# Patient Record
Sex: Female | Born: 1980 | Race: White | Hispanic: No | Marital: Married | Smoking: Former smoker
Health system: Southern US, Community
[De-identification: ages and names within clinical notes are randomized; demographics above are authoritative.]

## PROBLEM LIST (undated history)

## (undated) DIAGNOSIS — F419 Anxiety disorder, unspecified: Secondary | ICD-10-CM

## (undated) DIAGNOSIS — F32A Depression, unspecified: Secondary | ICD-10-CM

## (undated) DIAGNOSIS — M549 Dorsalgia, unspecified: Secondary | ICD-10-CM

## (undated) DIAGNOSIS — E611 Iron deficiency: Secondary | ICD-10-CM

## (undated) DIAGNOSIS — E785 Hyperlipidemia, unspecified: Secondary | ICD-10-CM

## (undated) DIAGNOSIS — L719 Rosacea, unspecified: Secondary | ICD-10-CM

## (undated) DIAGNOSIS — M797 Fibromyalgia: Secondary | ICD-10-CM

## (undated) DIAGNOSIS — H00019 Hordeolum externum unspecified eye, unspecified eyelid: Secondary | ICD-10-CM

## (undated) HISTORY — DX: Hyperlipidemia, unspecified: E78.5

## (undated) HISTORY — DX: Rosacea, unspecified: L71.9

## (undated) HISTORY — DX: Fibromyalgia: M79.7

## (undated) HISTORY — DX: Dorsalgia, unspecified: M54.9

## (undated) HISTORY — DX: Depression, unspecified: F32.A

## (undated) HISTORY — DX: Iron deficiency: E61.1

## (undated) HISTORY — PX: WISDOM TOOTH EXTRACTION: SHX21

## (undated) HISTORY — DX: Hordeolum externum unspecified eye, unspecified eyelid: H00.019

## (undated) HISTORY — DX: Anxiety disorder, unspecified: F41.9

---
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
## 2002-04-22 ENCOUNTER — Inpatient Hospital Stay (HOSPITAL_COMMUNITY): Admission: AD | Admit: 2002-04-22 | Discharge: 2002-04-24 | Payer: Self-pay | Admitting: *Deleted

## 2004-11-20 ENCOUNTER — Emergency Department (HOSPITAL_COMMUNITY): Admission: EM | Admit: 2004-11-20 | Discharge: 2004-11-20 | Payer: Self-pay | Admitting: Emergency Medicine

## 2005-02-18 ENCOUNTER — Emergency Department (HOSPITAL_COMMUNITY): Admission: EM | Admit: 2005-02-18 | Discharge: 2005-02-18 | Payer: Self-pay | Admitting: Emergency Medicine

## 2005-04-12 ENCOUNTER — Inpatient Hospital Stay (HOSPITAL_COMMUNITY): Admission: AD | Admit: 2005-04-12 | Discharge: 2005-04-12 | Payer: Self-pay | Admitting: Obstetrics and Gynecology

## 2006-06-25 ENCOUNTER — Encounter: Admission: RE | Admit: 2006-06-25 | Discharge: 2006-06-25 | Payer: Self-pay | Admitting: Orthopaedic Surgery

## 2006-12-16 ENCOUNTER — Emergency Department (HOSPITAL_COMMUNITY): Admission: EM | Admit: 2006-12-16 | Discharge: 2006-12-17 | Payer: Self-pay | Admitting: Emergency Medicine

## 2007-04-22 ENCOUNTER — Encounter: Admission: RE | Admit: 2007-04-22 | Discharge: 2007-04-22 | Payer: Self-pay | Admitting: Family Medicine

## 2007-04-25 ENCOUNTER — Encounter: Admission: RE | Admit: 2007-04-25 | Discharge: 2007-04-25 | Payer: Self-pay | Admitting: Family Medicine

## 2007-05-02 ENCOUNTER — Encounter: Admission: RE | Admit: 2007-05-02 | Discharge: 2007-05-02 | Payer: Self-pay | Admitting: Family Medicine

## 2007-12-13 ENCOUNTER — Emergency Department (HOSPITAL_BASED_OUTPATIENT_CLINIC_OR_DEPARTMENT_OTHER): Admission: EM | Admit: 2007-12-13 | Discharge: 2007-12-13 | Payer: Self-pay | Admitting: Emergency Medicine

## 2008-01-01 ENCOUNTER — Emergency Department (HOSPITAL_BASED_OUTPATIENT_CLINIC_OR_DEPARTMENT_OTHER): Admission: EM | Admit: 2008-01-01 | Discharge: 2008-01-01 | Payer: Self-pay | Admitting: Emergency Medicine

## 2008-07-30 ENCOUNTER — Encounter: Admission: RE | Admit: 2008-07-30 | Discharge: 2008-07-30 | Payer: Self-pay | Admitting: Orthopaedic Surgery

## 2008-09-28 ENCOUNTER — Emergency Department (HOSPITAL_BASED_OUTPATIENT_CLINIC_OR_DEPARTMENT_OTHER): Admission: EM | Admit: 2008-09-28 | Discharge: 2008-09-28 | Payer: Self-pay | Admitting: Emergency Medicine

## 2009-01-12 ENCOUNTER — Emergency Department (HOSPITAL_BASED_OUTPATIENT_CLINIC_OR_DEPARTMENT_OTHER): Admission: EM | Admit: 2009-01-12 | Discharge: 2009-01-12 | Payer: Self-pay | Admitting: Emergency Medicine

## 2010-04-06 ENCOUNTER — Emergency Department (HOSPITAL_BASED_OUTPATIENT_CLINIC_OR_DEPARTMENT_OTHER): Admission: EM | Admit: 2010-04-06 | Discharge: 2010-04-06 | Payer: Self-pay | Admitting: Emergency Medicine

## 2010-04-06 ENCOUNTER — Ambulatory Visit: Payer: Self-pay | Admitting: Diagnostic Radiology

## 2010-06-08 ENCOUNTER — Emergency Department (HOSPITAL_BASED_OUTPATIENT_CLINIC_OR_DEPARTMENT_OTHER)
Admission: EM | Admit: 2010-06-08 | Discharge: 2010-06-08 | Payer: Self-pay | Source: Home / Self Care | Admitting: Emergency Medicine

## 2010-07-31 ENCOUNTER — Encounter: Payer: Self-pay | Admitting: Family Medicine

## 2010-07-31 ENCOUNTER — Encounter: Payer: Self-pay | Admitting: Orthopaedic Surgery

## 2010-09-20 LAB — HEMOCCULT GUIAC POC 1CARD (OFFICE): Fecal Occult Bld: POSITIVE

## 2010-09-22 LAB — URINALYSIS, ROUTINE W REFLEX MICROSCOPIC
Glucose, UA: NEGATIVE mg/dL
Ketones, ur: 15 mg/dL — AB
Leukocytes, UA: NEGATIVE
Nitrite: NEGATIVE
Protein, ur: NEGATIVE mg/dL
Specific Gravity, Urine: 1.031 — ABNORMAL HIGH (ref 1.005–1.030)
Urobilinogen, UA: 0.2 mg/dL (ref 0.0–1.0)
pH: 5.5 (ref 5.0–8.0)

## 2010-09-22 LAB — URINE MICROSCOPIC-ADD ON

## 2010-09-22 LAB — PREGNANCY, URINE: Preg Test, Ur: NEGATIVE

## 2010-10-16 LAB — URINE MICROSCOPIC-ADD ON

## 2010-10-16 LAB — URINALYSIS, ROUTINE W REFLEX MICROSCOPIC
Bilirubin Urine: NEGATIVE
Glucose, UA: NEGATIVE mg/dL
Protein, ur: NEGATIVE mg/dL
Urobilinogen, UA: 1 mg/dL (ref 0.0–1.0)

## 2011-04-27 LAB — DIFFERENTIAL
Eosinophils Absolute: 0.4
Eosinophils Relative: 5
Lymphs Abs: 3.3
Monocytes Absolute: 0.7
Monocytes Relative: 9

## 2011-04-27 LAB — CBC
MCHC: 34
RBC: 4.02
RDW: 11.7

## 2011-04-27 LAB — WET PREP, GENITAL
Clue Cells Wet Prep HPF POC: NONE SEEN
Trich, Wet Prep: NONE SEEN
Yeast Wet Prep HPF POC: NONE SEEN

## 2011-04-27 LAB — COMPREHENSIVE METABOLIC PANEL
ALT: 20
AST: 13
Calcium: 8.6
GFR calc Af Amer: >60
Potassium: 3.5
Sodium: 139
Total Protein: 6.4

## 2011-04-27 LAB — PREGNANCY, URINE: Preg Test, Ur: NEGATIVE

## 2011-04-27 LAB — URINALYSIS, ROUTINE W REFLEX MICROSCOPIC
Ketones, ur: NEGATIVE
Nitrite: NEGATIVE
Protein, ur: NEGATIVE
pH: 6

## 2014-11-26 DIAGNOSIS — G8929 Other chronic pain: Secondary | ICD-10-CM | POA: Insufficient documentation

## 2014-11-26 DIAGNOSIS — M545 Low back pain, unspecified: Secondary | ICD-10-CM | POA: Insufficient documentation

## 2017-04-13 ENCOUNTER — Encounter (HOSPITAL_COMMUNITY): Payer: Self-pay | Admitting: Emergency Medicine

## 2017-04-13 ENCOUNTER — Emergency Department (HOSPITAL_COMMUNITY)
Admission: EM | Admit: 2017-04-13 | Discharge: 2017-04-13 | Disposition: A | Discharge status: Home / Self Care | Payer: 59 | Attending: Emergency Medicine | Admitting: Emergency Medicine

## 2017-04-13 DIAGNOSIS — F410 Panic disorder [episodic paroxysmal anxiety] without agoraphobia: Secondary | ICD-10-CM

## 2017-04-13 DIAGNOSIS — R0602 Shortness of breath: Secondary | ICD-10-CM | POA: Diagnosis present

## 2017-04-13 NOTE — ED Notes (Signed)
Bed: [facility] Expected date:  Expected time:  Means of arrival:  Comments: EMS wants medical clearance

## 2017-04-13 NOTE — ED Triage Notes (Signed)
Pt having psych problems related to marriage conflict and miscommunications.   pt non Si HI, no drugs on board V/s 142/86, pulse 82, rr 14, 98 room air

## 2017-04-13 NOTE — ED Provider Notes (Signed)
[facility]-EMERGENCY DEPT Provider Note   CSN: [id number] Arrival date & time: 04/13/17  0037     History   Chief Complaint Chief Complaint  Patient presents with  . Psychiatric Evaluation    HPI Valerie Walters is a 36 y.o. female.  HPI  36 year old female presents by EMS after suffering a panic attack. She states that for the past year she has been having marital trouble with her husband. She frequently has been suspicious about different emails and phone numbers that she sees on his computer. However she states that whenever she tries to capture they seem to be disappearing. She is concerned that spying on her and deleting this images. Tonight she took a picture of an email that she has seen before but he denied knowing about. However when she went to go show him, the picture was not on her eye pad despite her daughter seeing her takes a picture. She became very frustrated. She called her mom for advice and she told her to calm down. During this time she seemed to have trouble breathing and was getting more and more anxious. She called 911. She is much calm her now. She states she thinks she is to see someone for these marital issues and her other issues to make sure she is not "crazy". She denies suicidal or homicidal thoughts. She states she has a history of depression when her dad died and was put on something but it made her feel weird so she stopped. She tells me the prickly she has a hard time going out into a grocery store or other stores because once she gets in the bright lights, she feels anxiety and has to leave. She has been having trouble sleeping. Otherwise has not been feeling ill.   History reviewed. No pertinent past medical history.  There are no active problems to display for this patient.   History reviewed. No pertinent surgical history.  OB History    No data available       Home Medications    Prior to Admission medications   Not on File    Family  History No family history on file.  Social History Social History  Substance Use Topics  . Smoking status: Not on file  . Smokeless tobacco: Not on file  . Alcohol use Not on file     Allergies   Patient has no allergy information on record.   Review of Systems Review of Systems  Constitutional: Negative for fever.  Respiratory: Positive for shortness of breath.   Cardiovascular: Negative for chest pain.  Gastrointestinal: Negative for abdominal pain and vomiting.  Psychiatric/Behavioral: Positive for dysphoric mood. Negative for suicidal ideas. The patient is nervous/anxious.   All other systems reviewed and are negative.    Physical Exam Updated Vital Signs BP 129/84 (BP Location: Left Arm)   Pulse 89   Temp 98.9 F (37.2 C) (Oral)   Resp 18   SpO2 99%   Physical Exam  Constitutional: She is oriented to person, place, and time. She appears well-developed and well-nourished.  HENT:  Head: Normocephalic and atraumatic.  Right Ear: External ear normal.  Left Ear: External ear normal.  Nose: Nose normal.  Eyes: Right eye exhibits no discharge. Left eye exhibits no discharge.  Cardiovascular: Normal rate, regular rhythm and normal heart sounds.   Pulmonary/Chest: Effort normal and breath sounds normal.  Abdominal: Soft. There is no tenderness.  Neurological: She is alert and oriented to person, place, and time.  Skin: Skin is warm and dry.  Psychiatric: She is not actively hallucinating. She expresses no homicidal and no suicidal ideation.  Tearful during certain parts of the conversation. However not overtly depressed or currently anxious  Nursing note and vitals reviewed.    ED Treatments / Results  Labs (all labs ordered are listed, but only abnormal results are displayed) Labs Reviewed - No data to display  EKG  EKG Interpretation None       Radiology No results found.  Procedures Procedures (including critical care time)  Medications Ordered  in ED Medications - No data to display   Initial Impression / Assessment and Plan / ED Course  I have reviewed the triage vital signs and the nursing notes.  Pertinent labs & imaging results that were available during my care of the patient were reviewed by me and considered in my medical decision making (see chart for details).     At this point, the patient appears medically stable. Her transient shortness of breath was almost undoubtedly from acute anxiety due to her worsening marital situation. No chest pain or other concerning symptoms. She is not suicidal or homicidal. His possible she's paranoid and making up some of these issues with her husband but at this point she does not appear acutely psychotic. I discussed she does not need inpatient admission is on this current evaluation and that an outpatient psychiatry follow-up and be best in her interest. She agrees. Given resources and discussed return per cautions.  Final Clinical Impressions(s) / ED Diagnoses   Final diagnoses:  Panic attack    New Prescriptions New Prescriptions   No medications on file     Pricilla Loveless, MD 04/13/17 [phone]

## 2017-04-13 NOTE — ED Notes (Signed)
Dr. Criss Alvine at bedside evaluating patient

## 2018-02-20 ENCOUNTER — Telehealth: Payer: Self-pay | Admitting: Family Medicine

## 2018-02-20 ENCOUNTER — Encounter: Payer: Self-pay | Admitting: Family Medicine

## 2018-02-20 ENCOUNTER — Ambulatory Visit (INDEPENDENT_AMBULATORY_CARE_PROVIDER_SITE_OTHER): Payer: 59 | Admitting: Family Medicine

## 2018-02-20 VITALS — BP 132/84 | HR 95 | Temp 98.1°F | Resp 16 | Ht 63.5 in | Wt 170.0 lb

## 2018-02-20 DIAGNOSIS — F22 Delusional disorders: Secondary | ICD-10-CM

## 2018-02-20 DIAGNOSIS — F309 Manic episode, unspecified: Secondary | ICD-10-CM | POA: Insufficient documentation

## 2018-02-20 DIAGNOSIS — F418 Other specified anxiety disorders: Secondary | ICD-10-CM | POA: Diagnosis not present

## 2018-02-20 DIAGNOSIS — N979 Female infertility, unspecified: Secondary | ICD-10-CM | POA: Diagnosis not present

## 2018-02-20 DIAGNOSIS — Z7689 Persons encountering health services in other specified circumstances: Secondary | ICD-10-CM | POA: Diagnosis not present

## 2018-02-20 LAB — TSH: TSH: 1.39 u[IU]/mL (ref 0.35–4.50)

## 2018-02-20 LAB — LUTEINIZING HORMONE: LH: 8.93 m[IU]/mL

## 2018-02-20 LAB — FOLLICLE STIMULATING HORMONE: FSH: 8 m[IU]/mL

## 2018-02-20 MED ORDER — QUETIAPINE FUMARATE 100 MG PO TABS: ORAL_TABLET | ORAL | 0 refills | Status: DC

## 2018-02-20 NOTE — Progress Notes (Signed)
Patient ID: Valerie Walters, female  DOB: [birth year], 37 y.o.   MRN: [id number] Patient Care Team    Relationship Specialty Notifications Start End  Natalia Leatherwood, DO PCP - General Family Medicine  02/20/18     Chief Complaint  Patient presents with  . New Patient (Initial Visit)    Here to stablish care    Subjective:  Valerie Walters is a 37 y.o.  female present for new patient establishment with concerns for her mental health and fertility. All past medical history, surgical history, allergies, family history, immunizations, medications and social history were updated in the electronic medical record today. All recent labs, ED visits and hospitalizations within the last year were reviewed.  Infertility, female Pt is a married G4P2 present with her husband today. She states she is concerned she has fertility problems because she has not used any form of BCP in over 2 years and she has not become pregnant. Her cycles are monthly ~30 days in length and last 4 days, with moderate bleeding day 1 then lightens. Her last child was delivered via C-Section in 2013.   Mania (HCC)/Depression with anxiety/Paranoia [facility]) Patient and her husband are concerned over her mental health. She reports she was seen by a psychologist once. Per EMR review her prior PCP reports a possible past diagnosis of bipolar disorder. Patient does endorse trial of klonopin, zoloft and Wellbutrin in the past and she never continues medications because they made her feel "foggy". She has been seen int he ED and w/ prior pcp last year for her mental health. She reports panic attacks, heart racing, paranoia after stimulant use. She was vaping and taking unprescribed adderall at the time and caused her to have hallucinations and paranoia. She has noticed since that time caffeine, high sugar meals can also make her feel anxious.  Currently she reports a fear her husband will want a divorce. She and her husband has had marital  problems over the last year and she moved out. They reconciled recently and she moved back in. He is with her today and is supportive of her seeking help. She reports she is fearful to go to the grocery store (or leave house in general) because she thinks people are following her. She denied prior physical abuse on intake form. She admits to going 3-4 days with sleep. When this occurs she has more anxiety and paranoia. She feels her conditioned started to worsen after her fathers death "many years ago."  She endorses past ideations of suicide or just better off not being here, no plans.  Her cmp, cbc and tsh 04/2017 was normal in CE tab.    Depression screen PHQ 2/9 02/20/2018  Decreased Interest 3  Down, Depressed, Hopeless 3  PHQ - 2 Score 6  Altered sleeping 3  Tired, decreased energy 3  Change in appetite 3  Feeling bad or failure about yourself  3  Trouble concentrating 3  Moving slowly or fidgety/restless 3  Suicidal thoughts 1  PHQ-9 Score 25  Difficult doing work/chores Very difficult   GAD 7 : Generalized Anxiety Score 02/20/2018  Nervous, Anxious, on Edge 3  Control/stop worrying 3  Worry too much - different things 3  Trouble relaxing 3  Restless 3  Easily annoyed or irritable 3  Afraid - awful might happen 3  Total GAD 7 Score 21  Anxiety Difficulty Very difficult      No flowsheet data found.   There is no immunization history  on file for this patient.  No exam data present  Past Medical History:  Diagnosis Date  . Fibromyalgia    Not on File Past Surgical History:  Procedure Laterality Date  . CESAREAN SECTION  2013   Family History  Problem Relation Age of Onset  . Diabetes Father   . Hyperlipidemia Father   . Heart attack Father   . Prostate cancer Maternal Grandfather   . Heart attack Maternal Grandfather   . Diabetes Paternal Grandmother   . Alcohol abuse Paternal Grandfather   . Heart disease Paternal Grandfather    Social History    Socioeconomic History  . Marital status: Married    Spouse name: Valerie Walters  . Number of children: 2  . Years of education: Not on file  . Highest education level: Not on file  Occupational History  . Occupation: stay at home mother  Social Needs  . Financial resource strain: Not hard at all  . Food insecurity:    Worry: Never true    Inability: Never true  . Transportation needs:    Medical: No    Non-medical: No  Tobacco Use  . Smoking status: Former Smoker    Last attempt to quit: 02/21/2016    Years since quitting: 2.0  . Smokeless tobacco: Never Used  Substance and Sexual Activity  . Alcohol use: Yes    Comment: about twice a year  . Drug use: Never  . Sexual activity: Yes    Partners: Male  Lifestyle  . Physical activity:    Days per week: 3 days    Minutes per session: 40 min  . Stress: Not on file  Relationships  . Social connections:    Talks on phone: More than three times a week    Gets together: Never    Attends religious service: Never    Active member of club or organization: No    Attends meetings of clubs or organizations: Never    Relationship status: Married  . Intimate partner violence:    Fear of current or ex partner: No    Emotionally abused: No    Physically abused: No    Forced sexual activity: No  Other Topics Concern  . Not on file  Social History Narrative  . Not on file   Allergies as of 02/20/2018   Not on File     Medication List        Accurate as of 02/20/18 12:11 PM. Always use your most recent med list.          ibuprofen 200 MG tablet Commonly known as:  ADVIL,MOTRIN Take by mouth.   QUEtiapine 100 MG tablet Commonly known as:  SEROQUEL Take 1 tablet (100 mg total) by mouth at bedtime for 7 days, THEN 2 tablets (200 mg total) at bedtime for 23 days. Start taking on:  02/20/2018       All past medical history, surgical history, allergies, family history, immunizations andmedications were updated in the EMR today and  reviewed under the history and medication portions of their EMR.    No results found for this or any previous visit (from the past 2160 hour(s)).  No results found.   ROS: 14 pt review of systems performed and negative (unless mentioned in an HPI)  Objective: BP 132/84   Pulse 95   Temp 98.1 F (36.7 C) (Oral)   Resp 16   Ht 5' 3.5" (1.613 m)   Wt 170 lb (77.1 kg)   LMP  02/14/2018   SpO2 98%   BMI 29.64 kg/m  Gen: Afebrile. No acute distress. Nontoxic in appearance, well-developed, well-nourished,  pleasant overweight tearful female.  HENT: AT. [place].  MMM.no Cough on exam, no hoarseness on exam. Eyes:Pupils Equal Round Reactive to light, Extraocular movements intact,  Conjunctiva without redness, discharge or icterus. Neck/lymp/endocrine: Supple,no lymphadenopathy, no thyromegaly CV: RRR no murmur, no edema, +2/4 P posterior tibialis pulses. Chest: CTAB, no wheeze, rhonchi or crackles.  Abd: Soft. NTND. BS present.  Skin: Warm and well-perfused. Skin intact. Neuro/Msk: Normal gait. PERLA. EOMi. Alert. Oriented x3.   Psych: tearful, mildly anxious. Otherwise Normal affect, dress and demeanor. Normal speech. Normal thought content and judgment.   Assessment/plan: Lenore MannerStacy Pasion is a 37 y.o. female present for establish care.  Infertility, female - She and her husband were not actively trying to conceive, but had not been not trying either. No protection > 2 years. Normal cycles currently.  - PAP due this year, that 2016 normal- no co test.  - TSH - LH - FSH - if tsh is normal, will refer to gyn for further evaluation- [facility] in [place].   Mania (HCC)/Depression with anxiety/Paranoia (HCC) - Tearful today.Is acceptable to treatment. Husband is supportive. Discussed her options and feel she would benefit from outpatient psychiatry referral. She is stable today, no psychosis currently or SI/HI.  Past medication tried per pt and EMR: zoloft, Wellbutrin, klonopin.  Start seroquel  QHS taper to 200 mg QHS. Instructions provided.  - TSH - Ambulatory referral to Psychiatry - Emergency signs/symptoms discussed and where/when to seek treatment ([facility] most appropriate).  - pt and husband in agreement with plan.  - f/u 3-4 weeks, and medication will be tapered if needed. Will continue management until she is able to see psychiatry to take over management.     Return in about 1 month (around 03/23/2018).  Greater than 45 minutes was spent with patient, greater than 50% of that time was spent face-to-face with patient counseling, reviewing extensive records via EMR and coordinating care.   Note is dictated utilizing voice recognition software. Although note has been proof read prior to signing, occasional typographical errors still can be missed. If any questions arise, please do not hesitate to call for verification.  Electronically signed by: Felix Pacinienee Kuneff, DO [facility]

## 2018-02-20 NOTE — Patient Instructions (Addendum)
Follow up with me in 1 month (if established with psych- you will follow with them instead) Start Seroquel 1 tab about 1 hour before bed, then increase to 2 tabs in 8 days.   It was a pleasure to meet you today.    Please help us help you:  We are honored [facility]. Below you will find basic instructions that you may need to access in the future. Please help us help you by reading the instructions, which cover many of the frequent questions we experience.   Prescription refills and request:  -In order to allow more efficient response time, please call your pharmacy for all refills. They will forward the request electronically to us. This allows for the quickest possible response. Request left on a nurse line can take longer to refill, since these are checked as time allows between office patients and other phone calls.  - refill request can take up to 3-5 working days to complete.  - If request is sent electronically and request is appropiate, it is usually completed in 1-2 business days.  - all patients will need to be seen routinely for all chronic medical conditions requiring prescription medications (see follow-up below). If you are overdue for follow up on your condition, you will be asked to make an appointment and we will call in enough medication to cover you until your appointment (up to 30 days).  - all controlled substances will require a face to face visit to request/refill.  - if you desire your prescriptions to go through a new pharmacy, and have an active script at original pharmacy, you will need to call your pharmacy and have scripts transferred to new pharmacy. This is completed between the pharmacy locations and not by your provider.    Results: If any images or labs were ordered, it can take up to 1 week to get results depending on the test ordered and the lab/facility running and resulting the test. - Normal or stable results,  which do not need further discussion, may be released to your mychart immediately with attached note to you. A call may not be generated for normal results. Please make certain to sign up for mychart. If you have questions on how to activate your mychart you can call the front office.  - If your results need further discussion, our office will attempt to contact you via phone, and if unable to reach you after 2 attempts, we will release your abnormal result to your mychart with instructions.  - All results will be automatically released in mychart after 1 week.  - Your provider will provide you with explanation and instruction on all relevant material in your results. Please keep in mind, results and labs may appear confusing or abnormal to the untrained eye, but it does not mean they are actually abnormal for you personally. If you have any questions about your results that are not covered, or you desire more detailed explanation than what was provided, you should make an appointment with your provider to do so.   Our office handles many outgoing and incoming calls daily. If we have not contacted you within 1 week about your results, please check your mychart to see if there is a message first and if not, then contact our office.  In helping with this matter, you help decrease call volume, and therefore allow us to be able to respond to patients needs more efficiently.  Acute office visits (sick visit):  An acute visit is intended for a new problem and are scheduled in shorter time slots to allow schedule openings for patients with new problems. This is the appropriate visit to discuss a new problem. Problems will not be addressed by phone call or Echart message. Appointment is needed if requesting treatment. In order to provide you with excellent quality medical care with proper time for you to explain your problem, have an exam and receive treatment with instructions, these appointments should be limited  to one new problem per visit. If you experience a new problem, in which you desire to be addressed, please make an acute office visit, we save openings on the schedule to accommodate you. Please do not save your new problem for any other type of visit, let us take care of it properly and quickly for you.   Follow up visits:  Depending on your condition(s) your provider will need to see you routinely in order to provide you with quality care and prescribe medication(s). Most chronic conditions (Example: hypertension, Diabetes, depression/anxiety... etc), require visits a couple times a year. Your provider will instruct you on proper follow up for your personal medical conditions and history. Please make certain to make follow up appointments for your condition as instructed. Failing to do so could result in lapse in your medication treatment/refills. If you request a refill, and are overdue to be seen on a condition, we will always provide you with a 30 day script (once) to allow you time to schedule.    Medicare wellness (well visit): - we have a wonderful Nurse Maudie Mercury), that will meet with you and provide you will yearly medicare wellness visits. These visits should occur yearly (can not be scheduled less than 1 calendar year apart) and cover preventive health, immunizations, advance directives and screenings you are entitled to yearly through your medicare benefits. Do not miss out on your entitled benefits, this is when medicare will pay for these benefits to be ordered for you.  These are strongly encouraged by your provider and is the appropriate type of visit to make certain you are up to date with all preventive health benefits. If you have not had your medicare wellness exam in the last 12 months, please make certain to schedule one by calling the office and schedule your medicare wellness with Maudie Mercury as soon as possible.   Yearly physical (well visit):  - Adults are recommended to be seen yearly for  physicals. Check with your insurance and date of your last physical, most insurances require one calendar year between physicals. Physicals include all preventive health topics, screenings, medical exam and labs that are appropriate for gender/age and history. You may have fasting labs needed at this visit. This is a well visit (not a sick visit), new problems should not be covered during this visit (see acute visit).  - Pediatric patients are seen more frequently when they are younger. Your provider will advise you on well child visit timing that is appropriate for your their age. - This is not a medicare wellness visit. Medicare wellness exams do not have an exam portion to the visit. Some medicare companies allow for a physical, some do not allow a yearly physical. If your medicare allows a yearly physical you can schedule the medicare wellness with our nurse Maudie Mercury and have your physical with your provider after, on the same day. Please check with insurance for your full benefits.   Late Policy/No Shows:  -  all new patients should arrive 15-30 minutes earlier than appointment to allow us time  to  obtain all personal demographics,  insurance information and for you to complete office paperwork. - All established patients should arrive 10-15 minutes earlier than appointment time to update all information and be checked in .  - In our best efforts to run on time, if you are late for your appointment you will be asked to either reschedule or if able, we will work you back into the schedule. There will be a wait time to work you back in the schedule,  depending on availability.  - If you are unable to make it to your appointment as scheduled, please call 24 hours ahead of time to allow us to fill the time slot with someone else who needs to be seen. If you do not cancel your appointment ahead of time, you may be charged a no show fee.

## 2018-02-20 NOTE — Telephone Encounter (Signed)
Please inform patient the following information: Her LH/FSH, which are female hormones are normal. Her thyroid is also functioning normally. I have referred her to Gynecology for further evaluation of her fertility, with her preference of the [place] location for her if in he network.  I had also referred her to psychiatry and they will also call to schedule her.  Follow up with me in 3.5 weeks to follow up on med start and if needing emergent care [facility] would be the better location.

## 2018-02-21 NOTE — Telephone Encounter (Signed)
Phone call attempted, no answer and unable to leave message. Will try to call back at later time.

## 2018-02-22 MED ORDER — HYDROXYZINE PAMOATE 25 MG PO CAPS: 25.0000 mg | ORAL_CAPSULE | Freq: Every day | ORAL | 2 refills | Status: DC

## 2018-02-22 MED ORDER — OLANZAPINE-FLUOXETINE HCL 3-25 MG PO CAPS: 1.0000 | ORAL_CAPSULE | Freq: Every evening | ORAL | 2 refills | Status: DC

## 2018-02-22 NOTE — Telephone Encounter (Signed)
-  Pt could not tolerate seroquel. Spoke with her about use of symbyax and vistaril. She is to start the symbyax and after 1-2 days if not causing her to be sedated, she can taper vistaril 1-2 tabs a night. I called in symbyax to take nightly and vistaril 25-50 mg QHS PRN . Pt voiced understanding on meds and proper use.

## 2018-02-22 NOTE — Telephone Encounter (Signed)
Patient notified and verbalized understanding. Patient stated that she can not take the Seroquel. "Caused vomiting and her body to feel like it was burning from the inside out after one dose." Patient requesting something for sleep.

## 2018-03-18 ENCOUNTER — Encounter: Payer: Self-pay | Admitting: Family Medicine

## 2018-03-18 ENCOUNTER — Ambulatory Visit (INDEPENDENT_AMBULATORY_CARE_PROVIDER_SITE_OTHER): Payer: 59 | Admitting: Family Medicine

## 2018-03-18 VITALS — BP 121/85 | HR 72 | Temp 98.0°F | Resp 20 | Ht 64.0 in | Wt 173.0 lb

## 2018-03-18 DIAGNOSIS — Z23 Encounter for immunization: Secondary | ICD-10-CM | POA: Diagnosis not present

## 2018-03-18 DIAGNOSIS — F418 Other specified anxiety disorders: Secondary | ICD-10-CM | POA: Diagnosis not present

## 2018-03-18 DIAGNOSIS — F309 Manic episode, unspecified: Secondary | ICD-10-CM | POA: Diagnosis not present

## 2018-03-18 DIAGNOSIS — F22 Delusional disorders: Secondary | ICD-10-CM | POA: Diagnosis not present

## 2018-03-18 MED ORDER — OLANZAPINE-FLUOXETINE HCL 3-25 MG PO CAPS: 1.0000 | ORAL_CAPSULE | Freq: Every evening | ORAL | 2 refills | Status: DC

## 2018-03-18 MED ORDER — HYDROXYZINE PAMOATE 25 MG PO CAPS: 25.0000 mg | ORAL_CAPSULE | Freq: Every day | ORAL | 2 refills | Status: DC

## 2018-03-18 NOTE — Progress Notes (Signed)
Patient ID: Valerie Walters, female  DOB: 1980-08-06, 37 y.o.   MRN: [id number] Patient Care Team    Relationship Specialty Notifications Start End  Natalia Leatherwood, DO PCP - General Family Medicine  02/20/18     Chief Complaint  Patient presents with  . Depression  . Anxiety    Subjective:  Valerie Walters is a 37 y.o.  female present for follow up depression/anxiety  Mania (HCC)/Depression with anxiety/Paranoia (HCC) Pt reports she is doing better than prior. She has been using the vistaril 1 tab about twice a day. She denies feeling tired after use. She was just able to start The Symbyax 2 weeks ago,  since it was out of stock. She reports today she has been able to go to the grocery store a few times and did ok. She still has some moments of fear, but overall is "way better." She has yet to hear from psychiatry to set up appt.  Prior note:  Patient and her husband are concerned over her mental health. She reports she was seen by a psychologist once. Per EMR review her prior PCP reports a possible past diagnosis of bipolar disorder. Patient does endorse trial of klonopin, zoloft and Wellbutrin in the past and she never continues medications because they made her feel "foggy". She has been seen int he ED and w/ prior pcp last year for her mental health. She reports panic attacks, heart racing, paranoia after stimulant use. She was vaping and taking unprescribed adderall at the time and caused her to have hallucinations and paranoia. She has noticed since that time caffeine, high sugar meals can also make her feel anxious.  Currently she reports a fear her husband will want a divorce. She and her husband has had marital problems over the last year and she moved out. They reconciled recently and she moved back in. He is with her today and is supportive of her seeking help. She reports she is fearful to go to the grocery store (or leave house in general) because she thinks people are following  her. She denied prior physical abuse on intake form. She admits to going 3-4 days with sleep. When this occurs she has more anxiety and paranoia. She feels her conditioned started to worsen after her fathers death "many years ago."  She endorses past ideations of suicide or just better off not being here, no plans.  Her cmp, cbc and tsh 04/2017 was normal in CE tab.    Depression screen [facility] 2/9 03/18/2018 02/20/2018  Decreased Interest 2 3  Down, Depressed, Hopeless 2 3  PHQ - 2 Score 4 6  Altered sleeping 1 3  Tired, decreased energy 1 3  Change in appetite 0 3  Feeling bad or failure about yourself  3 3  Trouble concentrating 3 3  Moving slowly or fidgety/restless 0 3  Suicidal thoughts 0 1  PHQ-9 Score 12 25  Difficult doing work/chores Somewhat difficult Very difficult   GAD 7 : Generalized Anxiety Score 03/18/2018 02/20/2018  Nervous, Anxious, on Edge 2 3  Control/stop worrying 3 3  Worry too much - different things 3 3  Trouble relaxing 3 3  Restless 3 3  Easily annoyed or irritable 2 3  Afraid - awful might happen 2 3  Total GAD 7 Score 18 21  Anxiety Difficulty Very difficult Very difficult      No flowsheet data found.  Immunization History  Administered Date(s) Administered  . Influenza,inj,Quad PF,6+ Mos  03/18/2018  . Influenza-Unspecified 05/13/2012  . Tdap 04/12/2003, 07/22/2014    No exam data present  Past Medical History:  Diagnosis Date  . Back pain    CE tab, [facility] system visits.   . Fibromyalgia   . Hordeolum externum (stye)   . Hyperlipidemia   . Iron deficiency   . Rosacea    No Known Allergies Past Surgical History:  Procedure Laterality Date  . CESAREAN SECTION  2013  . WISDOM TOOTH EXTRACTION     Family History  Problem Relation Age of Onset  . Diabetes Father   . Hyperlipidemia Father   . Heart attack Father   . Prostate cancer Maternal Grandfather        metz to bone  . Heart attack Maternal Grandfather   . Diabetes Paternal  Grandmother   . Alcohol abuse Paternal Grandfather   . Heart disease Paternal Grandfather    Social History   Socioeconomic History  . Marital status: Married    Spouse name: Tammy Sours  . Number of children: 2  . Years of education: Not on file  . Highest education level: Not on file  Occupational History  . Occupation: stay at home mother  Social Needs  . Financial resource strain: Not hard at all  . Food insecurity:    Worry: Never true    Inability: Never true  . Transportation needs:    Medical: No    Non-medical: No  Tobacco Use  . Smoking status: Former Smoker    Last attempt to quit: 02/21/2016    Years since quitting: 2.0  . Smokeless tobacco: Never Used  Substance and Sexual Activity  . Alcohol use: Yes    Comment: about twice a year  . Drug use: Not Currently    Comment: had taken adderall notprescribed in the past.   . Sexual activity: Yes    Partners: Male    Comment: marrried  Lifestyle  . Physical activity:    Days per week: 3 days    Minutes per session: 40 min  . Stress: Not on file  Relationships  . Social connections:    Talks on phone: More than three times a week    Gets together: Never    Attends religious service: Never    Active member of club or organization: No    Attends meetings of clubs or organizations: Never    Relationship status: Married  . Intimate partner violence:    Fear of current or ex partner: No    Emotionally abused: No    Physically abused: No    Forced sexual activity: No  Other Topics Concern  . Not on file  Social History Narrative   Marital status/children/pets: married, 2 children   Education/employment: some college.    Safety:      -Wears a bicycle helmet riding a bike: No     -smoke alarm in the home:Yes     - wears seatbelt: Yes     - Feels safe in their relationships: Yes   Allergies as of 03/18/2018   No Known Allergies     Medication List        Accurate as of 03/18/18 12:42 PM. Always use your most  recent med list.          hydrOXYzine 25 MG capsule Commonly known as:  VISTARIL Take 1-2 capsules (25-50 mg total) by mouth at bedtime.   OLANZapine-FLUoxetine 3-25 MG capsule Commonly known as:  SYMBYAX Take 1 capsule by mouth  every evening.       All past medical history, surgical history, allergies, family history, immunizations andmedications were updated in the EMR today and reviewed under the history and medication portions of their EMR.    Recent Results (from the past 2160 hour(s))  TSH     Status: None   Collection Time: 02/20/18 10:40 AM  Result Value Ref Range   TSH 1.39 0.35 - 4.50 uIU/mL  LH     Status: None   Collection Time: 02/20/18 10:40 AM  Result Value Ref Range   LH 8.93 mIU/mL    Comment: Female Reference Range:20-70 yrs     1.5-9.3 mIU/mL>70 yrs       3.1-35.6 mIU/mLFemale Reference Range:Follicular Phase     1.9-12.5 mIU/mLMidcycle             8.7-76.3 mIU/mLLuteal Phase         0.5-16.9 mIU/mL  Post Menopausal      15.9-54.0  mIU/mLPregnant             <1.5 mIU/mLContraceptives       0.7-5.6 mIU/mL   FSH     Status: None   Collection Time: 02/20/18 10:40 AM  Result Value Ref Range   FSH 8.0 mIU/ML    Comment: Female Reference Range:  1.4-18.1 mIU/mLFemale Reference Range:Follicular Phase          2.5-10.2 mIU/mLMidCycle Peak          3.4-33.4 mIU/mLLuteal Phase          1.5-9.1 mIU/mLPost Menopausal     23.0-116.3 mIU/mLPregnant          <0.3 mIU/mL    No results found.   ROS: 14 pt review of systems performed and negative (unless mentioned in an HPI)  Objective: BP 121/85 (BP Location: Left Arm, Patient Position: Sitting, Cuff Size: Large)   Pulse 72   Temp 98 F (36.7 C)   Resp 20   Ht 5\' 4"  (1.626 m)   Wt 173 lb (78.5 kg)   LMP 03/15/2018   SpO2 97%   BMI 29.70 kg/m  Gen: Afebrile. No acute distress. Nontoxic in presentation.  HENT: AT. [place].   Eyes:Pupils Equal Round Reactive to light, Extraocular movements intact,  Conjunctiva without  redness, discharge or icterus. CV: RRR Chest: CTAB, no wheeze or crackles Neuro:  Normal gait. PERLA. EOMi. Alert. Oriented.  Psych: Normal affect, dress and demeanor. Normal speech. Normal thought content and judgment..     Assessment/plan: Lowanda Cashaw is a 37 y.o. female present for establish care.  Mania (HCC)/Depression with anxiety/Paranoia (HCC) - Much improvement on current medications. Hopefully she may notice more benefit in a few weeks. Husband is supportive. - continue symbyax 3-25 mg a night and vistaril 25 mg TID PRN Past medication tried per pt and EMR: zoloft, Wellbutrin, klonopin, seroquel  Start seroquel QHS taper to 200 mg QHS. Instructions provided.  - TSH- normal - Ambulatory referral to Psychiatry--> awaiting appt. - f/u in 7 weeks if not established yet with psychiatry which will take of prescribing.   - flu shot administered today.   Return in about 8 weeks (around 05/13/2018).  Greater than 45 minutes was spent with patient, greater than 50% of that time was spent face-to-face with patient counseling, reviewing extensive records via EMR and coordinating care.   Note is dictated utilizing voice recognition software. Although note has been proof read prior to signing, occasional typographical errors still can be missed. If any questions arise, please do  not hesitate to call for verification.  Electronically signed by: Valerie Pouch, DO [place]

## 2018-03-18 NOTE — Patient Instructions (Addendum)
Continue vistaril every 8 hours if needed, watch for sedation.  Continue the Symbyax at current dose.  Follow up in 7 weeks, if not yet established with psychiatry.   Generalized Anxiety Disorder, Adult Generalized anxiety disorder (GAD) is a mental health disorder. People with this condition constantly worry about everyday events. Unlike normal anxiety, worry related to GAD is not triggered by a specific event. These worries also do not fade or get better with time. GAD interferes with life functions, including relationships, work, and school. GAD can vary from mild to severe. People with severe GAD can have intense waves of anxiety with physical symptoms (panic attacks). What are the causes? The exact cause of GAD is not known. What increases the risk? This condition is more likely to develop in:  Women.  People who have a family history of anxiety disorders.  People who are very shy.  People who experience very stressful life events, such as the death of a loved one.  People who have a very stressful family environment.  What are the signs or symptoms? People with GAD often worry excessively about many things in their lives, such as their health and family. They may also be overly concerned about:  Doing well at work.  Being on time.  Natural disasters.  Friendships.  Physical symptoms of GAD include:  Fatigue.  Muscle tension or having muscle twitches.  Trembling or feeling shaky.  Being easily startled.  Feeling like your heart is pounding or racing.  Feeling out of breath or like you cannot take a deep breath.  Having trouble falling asleep or staying asleep.  Sweating.  Nausea, diarrhea, or irritable bowel syndrome (IBS).  Headaches.  Trouble concentrating or remembering facts.  Restlessness.  Irritability.  How is this diagnosed? Your health care provider can diagnose GAD based on your symptoms and medical history. You will also have a physical  exam. The health care provider will ask specific questions about your symptoms, including how severe they are, when they started, and if they come and go. Your health care provider may ask you about your use of alcohol or drugs, including prescription medicines. Your health care provider may refer you to a mental health specialist for further evaluation. Your health care provider will do a thorough examination and may perform additional tests to rule out other possible causes of your symptoms. To be diagnosed with GAD, a person must have anxiety that:  Is out of his or her control.  Affects several different aspects of his or her life, such as work and relationships.  Causes distress that makes him or her unable to take part in normal activities.  Includes at least three physical symptoms of GAD, such as restlessness, fatigue, trouble concentrating, irritability, muscle tension, or sleep problems.  Before your health care provider can confirm a diagnosis of GAD, these symptoms must be present more days than they are not, and they must last for six months or longer. How is this treated? The following therapies are usually used to treat GAD:  Medicine. Antidepressant medicine is usually prescribed for long-term daily control. Antianxiety medicines may be added in severe cases, especially when panic attacks occur.  Talk therapy (psychotherapy). Certain types of talk therapy can be helpful in treating GAD by providing support, education, and guidance. Options include: ? Cognitive behavioral therapy (CBT). People learn coping skills and techniques to ease their anxiety. They learn to identify unrealistic or negative thoughts and behaviors and to replace them with positive ones. ?  Acceptance and commitment therapy (ACT). This treatment teaches people how to be mindful as a way to cope with unwanted thoughts and feelings. ? Biofeedback. This process trains you to manage your body's response  (physiological response) through breathing techniques and relaxation methods. You will work with a therapist while machines are used to monitor your physical symptoms.  Stress management techniques. These include yoga, meditation, and exercise.  A mental health specialist can help determine which treatment is best for you. Some people see improvement with one type of therapy. However, other people require a combination of therapies. Follow these instructions at home:  Take over-the-counter and prescription medicines only as told by your health care provider.  Try to maintain a normal routine.  Try to anticipate stressful situations and allow extra time to manage them.  Practice any stress management or self-calming techniques as taught by your health care provider.  Do not punish yourself for setbacks or for not making progress.  Try to recognize your accomplishments, even if they are small.  Keep all follow-up visits as told by your health care provider. This is important. Contact a health care provider if:  Your symptoms do not get better.  Your symptoms get worse.  You have signs of depression, such as: ? A persistently sad, cranky, or irritable mood. ? Loss of enjoyment in activities that used to bring you joy. ? Change in weight or eating. ? Changes in sleeping habits. ? Avoiding friends or family members. ? Loss of energy for normal tasks. ? Feelings of guilt or worthlessness. Get help right away if:  You have serious thoughts about hurting yourself or others. If you ever feel like you may hurt yourself or others, or have thoughts about taking your own life, get help right away. You can go to your nearest emergency department or call:  Your local emergency services (911 in the [place]  A suicide crisis helpline, such as the National Suicide Prevention Lifeline at 820-141-6596. This is open 24 hours a day.  Summary  Generalized anxiety disorder (GAD) is a mental  health disorder that involves worry that is not triggered by a specific event.  People with GAD often worry excessively about many things in their lives, such as their health and family.  GAD may cause physical symptoms such as restlessness, trouble concentrating, sleep problems, frequent sweating, nausea, diarrhea, headaches, and trembling or muscle twitching.  A mental health specialist can help determine which treatment is best for you. Some people see improvement with one type of therapy. However, other people require a combination of therapies. This information is not intended to replace advice given to you by your health care provider. Make sure you discuss any questions you have with your health care provider. Document Released: 10/21/2012 Document Revised: 05/16/2016 Document Reviewed: 05/16/2016 Elsevier Interactive Patient Education  [employer].

## 2018-04-05 ENCOUNTER — Ambulatory Visit: Payer: 59 | Admitting: Psychology

## 2018-05-17 ENCOUNTER — Encounter: Payer: Self-pay | Admitting: Family Medicine

## 2018-05-17 ENCOUNTER — Ambulatory Visit (INDEPENDENT_AMBULATORY_CARE_PROVIDER_SITE_OTHER): Payer: 59 | Admitting: Family Medicine

## 2018-05-17 VITALS — BP 118/79 | HR 99 | Temp 98.4°F | Resp 20 | Ht 64.0 in | Wt 184.0 lb

## 2018-05-17 DIAGNOSIS — R05 Cough: Secondary | ICD-10-CM | POA: Diagnosis not present

## 2018-05-17 DIAGNOSIS — J0100 Acute maxillary sinusitis, unspecified: Secondary | ICD-10-CM

## 2018-05-17 DIAGNOSIS — R059 Cough, unspecified: Secondary | ICD-10-CM

## 2018-05-17 MED ORDER — BENZONATATE 100 MG PO CAPS: 200.0000 mg | ORAL_CAPSULE | Freq: Two times a day (BID) | ORAL | 0 refills | Status: DC | PRN

## 2018-05-17 MED ORDER — DOXYCYCLINE HYCLATE 100 MG PO TABS: 100.0000 mg | ORAL_TABLET | Freq: Two times a day (BID) | ORAL | 0 refills | Status: DC

## 2018-05-17 NOTE — Patient Instructions (Signed)
Rest, hydrate.  + flonase, mucinex DM, nasal saline.  Doxycyline prescribed, take until completed.  Tessalon perles If cough present it can last up to 6-8 weeks.  F/U 2 weeks of not improved.    Sinusitis, Adult Sinusitis is soreness and inflammation of your sinuses. Sinuses are hollow spaces in the bones around your face. They are located:  Around your eyes.  In the middle of your forehead.  Behind your nose.  In your cheekbones.  Your sinuses and nasal passages are lined with a stringy fluid (mucus). Mucus normally drains out of your sinuses. When your nasal tissues get inflamed or swollen, the mucus can get trapped or blocked so air cannot flow through your sinuses. This lets bacteria, viruses, and funguses grow, and that leads to infection. Follow these instructions at home: Medicines  Take, use, or apply over-the-counter and prescription medicines only as told by your doctor. These may include nasal sprays.  If you were prescribed an antibiotic medicine, take it as told by your doctor. Do not stop taking the antibiotic even if you start to feel better. Hydrate and Humidify  Drink enough water to keep your pee (urine) clear or pale yellow.  Use a cool mist humidifier to keep the humidity level in your home above 50%.  Breathe in steam for 10-15 minutes, 3-4 times a day or as told by your doctor. You can do this in the bathroom while a hot shower is running.  Try not to spend time in cool or dry air. Rest  Rest as much as possible.  Sleep with your head raised (elevated).  Make sure to get enough sleep each night. General instructions  Put a warm, moist washcloth on your face 3-4 times a day or as told by your doctor. This will help with discomfort.  Wash your hands often with soap and water. If there is no soap and water, use hand sanitizer.  Do not smoke. Avoid being around people who are smoking (secondhand smoke).  Keep all follow-up visits as told by your  doctor. This is important. Contact a doctor if:  You have a fever.  Your symptoms get worse.  Your symptoms do not get better within 10 days. Get help right away if:  You have a very bad headache.  You cannot stop throwing up (vomiting).  You have pain or swelling around your face or eyes.  You have trouble seeing.  You feel confused.  Your neck is stiff.  You have trouble breathing. This information is not intended to replace advice given to you by your health care provider. Make sure you discuss any questions you have with your health care provider. Document Released: 12/13/2007 Document Revised: 02/20/2016 Document Reviewed: 04/21/2015 Elsevier Interactive Patient Education  [employer].

## 2018-05-17 NOTE — Progress Notes (Signed)
Valerie Walters , 15-Feb-1981, 37 y.o., female MRN: [id number] Patient Care Team    Relationship Specialty Notifications Start End  Valerie Leatherwood, DO PCP - General Family Medicine  02/20/18     Chief Complaint  Patient presents with  . Cough    x 2 months     Subjective: Pt presents for an OV with complaints of cough of 2 months duration.  Associated symptoms include burning chest, intermittent production of phlegm, facial pressure and nasal drainage. She denies fever, chills , nausea, vomit or sore throat.  Pt has tried mucinex, nyquil, dayquil, cough drops and suppressants  to ease their symptoms. She smokes on occasion.   Depression screen Valerie Walters 2/9 03/18/2018 02/20/2018  Decreased Interest 2 3  Down, Depressed, Hopeless 2 3  PHQ - 2 Score 4 6  Altered sleeping 1 3  Tired, decreased energy 1 3  Change in appetite 0 3  Feeling bad or failure about yourself  3 3  Trouble concentrating 3 3  Moving slowly or fidgety/restless 0 3  Suicidal thoughts 0 1  PHQ-9 Score 12 25  Difficult doing work/chores Somewhat difficult Very difficult    No Known Allergies Social History   Tobacco Use  . Smoking status: Former Smoker    Last attempt to quit: 02/21/2016    Years since quitting: 2.2  . Smokeless tobacco: Never Used  Substance Use Topics  . Alcohol use: Yes    Comment: about twice a year   Past Medical History:  Diagnosis Date  . Back pain    CE tab, [facility] visits.   . Fibromyalgia   . Hordeolum externum (stye)   . Hyperlipidemia   . Iron deficiency   . Rosacea    Past Surgical History:  Procedure Laterality Date  . CESAREAN SECTION  2013  . WISDOM TOOTH EXTRACTION     Family History  Problem Relation Age of Onset  . Diabetes Father   . Hyperlipidemia Father   . Heart attack Father   . Prostate cancer Maternal Grandfather        metz to bone  . Heart attack Maternal Grandfather   . Diabetes Paternal Grandmother   . Alcohol abuse Paternal Grandfather   .  Heart disease Paternal Grandfather    Allergies as of 05/17/2018   No Known Allergies     Medication List        Accurate as of 05/17/18 10:40 AM. Always use your most recent med list.          GOODSENSE IBUPROFEN 200 MG tablet Generic drug:  ibuprofen 1 to 2 tablet as needed   hydrOXYzine 25 MG capsule Commonly known as:  VISTARIL Take 1-2 capsules (25-50 mg total) by mouth at bedtime.   OLANZapine-FLUoxetine 3-25 MG capsule Commonly known as:  SYMBYAX Take 1 capsule by mouth every evening.       All past medical history, surgical history, allergies, family history, immunizations andmedications were updated in the EMR today and reviewed under the history and medication portions of their EMR.     ROS: Negative, with the exception of above mentioned in HPI   Objective:  BP 118/79 (BP Location: Right Arm, Patient Position: Sitting, Cuff Size: Large)   Pulse 99   Temp 98.4 F (36.9 C)   Resp 20   Ht 5\' 4"  (1.626 m)   Wt 184 lb (83.5 kg)   SpO2 100%   BMI 31.58 kg/m  Body mass index is 31.58 kg/m.  Gen: Afebrile. No acute distress. Nontoxic in appearance, well developed, well nourished.  HENT: AT. [place]. Bilateral TM visualized with bilateral fullness. MMM, no oral lesions. Bilateral nares w/ erythema, drainage. Throat without erythema or exudates. Cough and max sinus pressure present Eyes:Pupils Equal Round Reactive to light, Extraocular movements intact,  Conjunctiva without redness, discharge or icterus. Neck/lymp/endocrine: Supple,no lymphadenopathy CV: RRR  Chest: CTAB, no wheeze or crackles. Good air movement, normal resp effort.  Abd: Soft. NTND. BS present.  Skin: no rashes, purpura or petechiae.  Neuro: Normal gait. PERLA. EOMi. Alert. Oriented x3  No exam data present No results found. No results found for this or any previous visit (from the past 24 hour(s)).  Assessment/Plan: [name] is a 37 y.o. female present for OV for  Acute non-recurrent  maxillary sinusitis/Cough Rest, hydrate.  + flonase, mucinex DM, nasal saline.  Doxycyline prescribed, take until completed.  Tessalon perles If cough present it can last up to 6-8 weeks.  F/U 2 weeks of not improved.    Reviewed expectations re: course of current medical issues.  Discussed self-management of symptoms.  Outlined signs and symptoms indicating need for more acute intervention.  Patient verbalized understanding and all questions were answered.  Patient received an After-Visit Summary.    No orders of the defined types were placed in this encounter.    Note is dictated utilizing voice recognition software. Although note has been proof read prior to signing, occasional typographical errors still can be missed. If any questions arise, please do not hesitate to call for verification.   electronically signed by:  Felix Pacini, DO  [facility] [place]

## 2018-05-24 ENCOUNTER — Other Ambulatory Visit: Payer: Self-pay | Admitting: *Deleted

## 2018-05-24 MED ORDER — BENZONATATE 100 MG PO CAPS: 200.0000 mg | ORAL_CAPSULE | Freq: Two times a day (BID) | ORAL | 0 refills | Status: DC | PRN

## 2018-05-29 ENCOUNTER — Encounter: Payer: Self-pay | Admitting: Family Medicine

## 2018-05-29 ENCOUNTER — Ambulatory Visit (INDEPENDENT_AMBULATORY_CARE_PROVIDER_SITE_OTHER): Payer: 59 | Admitting: Family Medicine

## 2018-05-29 ENCOUNTER — Ambulatory Visit (INDEPENDENT_AMBULATORY_CARE_PROVIDER_SITE_OTHER): Payer: 59 | Admitting: Psychology

## 2018-05-29 VITALS — BP 131/82 | HR 89 | Temp 97.9°F | Resp 20 | Ht 64.0 in | Wt 180.0 lb

## 2018-05-29 DIAGNOSIS — F3189 Other bipolar disorder: Secondary | ICD-10-CM

## 2018-05-29 DIAGNOSIS — B370 Candidal stomatitis: Secondary | ICD-10-CM

## 2018-05-29 DIAGNOSIS — J40 Bronchitis, not specified as acute or chronic: Secondary | ICD-10-CM

## 2018-05-29 DIAGNOSIS — J0100 Acute maxillary sinusitis, unspecified: Secondary | ICD-10-CM

## 2018-05-29 MED ORDER — IPRATROPIUM-ALBUTEROL 0.5-2.5 (3) MG/3ML IN SOLN
3.0000 mL | Freq: Once | RESPIRATORY_TRACT | Status: AC
  Administered 2018-05-29: 3 mL via RESPIRATORY_TRACT

## 2018-05-29 MED ORDER — METHYLPREDNISOLONE ACETATE 80 MG/ML IJ SUSP
80.0000 mg | Freq: Once | INTRAMUSCULAR | Status: AC
  Administered 2018-05-29: 80 mg via INTRAMUSCULAR

## 2018-05-29 MED ORDER — FLUCONAZOLE 150 MG PO TABS: 150.0000 mg | ORAL_TABLET | Freq: Once | ORAL | 0 refills | Status: AC

## 2018-05-29 MED ORDER — LEVOFLOXACIN 500 MG PO TABS: 500.0000 mg | ORAL_TABLET | Freq: Every day | ORAL | 0 refills | Status: DC

## 2018-05-29 MED ORDER — LEVOCETIRIZINE DIHYDROCHLORIDE 5 MG PO TABS: 5.0000 mg | ORAL_TABLET | Freq: Every evening | ORAL | 5 refills | Status: DC

## 2018-05-29 MED ORDER — NYSTATIN 100000 UNIT/ML MT SUSP
5.0000 mL | Freq: Four times a day (QID) | OROMUCOSAL | 0 refills | Status: DC
Start: 2018-05-29 — End: 2018-09-11

## 2018-05-29 NOTE — Progress Notes (Signed)
Valerie Walters , 1981-05-17, 37 y.o., female MRN: [id number] Patient Care Team    Relationship Specialty Notifications Start End  Natalia LeatherwoodKuneff, Renee A, DO PCP - General Family Medicine  02/20/18     Chief Complaint  Patient presents with  . Cough     Subjective:  Valerie Walters is a 37 y.o. female present for continued cough. She is an occasional smoker. She reports sinus pressure in her cheeks and teeth remain. She also still has a cough despite treatment with doxy and tessalon perles. She denies fever or chills.  She reports she also has had thrush for a few years intermittently. She use to have a "swish" and it helped. She is a smoker. She has not had further evaluation. She reports a fuzzy feeling white substance that turns colors on her tongue when she eats and burns.  Prior note:  Pt presents for an OV with complaints of cough of 2 months duration.  Associated symptoms include burning chest, intermittent production of phlegm, facial pressure and nasal drainage. She denies fever, chills , nausea, vomit or sore throat.  Pt has tried mucinex, nyquil, dayquil, cough drops and suppressants  to ease their symptoms. She smokes on occasion.   [facility] 03/18/2018 02/20/2018  Decreased Interest 2 3  Down, Depressed, Hopeless 2 3  PHQ - 2 Score 4 6  Altered sleeping 1 3  Tired, decreased energy 1 3  Change in appetite 0 3  Feeling bad or failure about yourself  3 3  Trouble concentrating 3 3  Moving slowly or fidgety/restless 0 3  Suicidal thoughts 0 1  PHQ-9 Score 12 25  Difficult doing work/chores Somewhat difficult Very difficult    No Known Allergies Social History   Tobacco Use  . Smoking status: Former Smoker    Last attempt to quit: 02/21/2016    Years since quitting: 2.2  . Smokeless tobacco: Never Used  Substance Use Topics  . Alcohol use: Yes    Comment: about twice a year   Past Medical History:  Diagnosis Date  . Back pain    CE tab, [facility] system  visits.   . Fibromyalgia   . Hordeolum externum (stye)   . Hyperlipidemia   . Iron deficiency   . Rosacea    Past Surgical History:  Procedure Laterality Date  . CESAREAN SECTION  2013  . WISDOM TOOTH EXTRACTION     Family History  Problem Relation Age of Onset  . Diabetes Father   . Hyperlipidemia Father   . Heart attack Father   . Prostate cancer Maternal Grandfather        metz to bone  . Heart attack Maternal Grandfather   . Diabetes Paternal Grandmother   . Alcohol abuse Paternal Grandfather   . Heart disease Paternal Grandfather    Allergies as of 05/29/2018   No Known Allergies     Medication List        Accurate as of 05/29/18  8:50 AM. Always use your most recent med list.          GOODSENSE IBUPROFEN 200 MG tablet Generic drug:  ibuprofen 1 to 2 tablet as needed   hydrOXYzine 25 MG capsule Commonly known as:  VISTARIL Take 1-2 capsules (25-50 mg total) by mouth at bedtime.   OLANZapine-FLUoxetine 3-25 MG capsule Commonly known as:  SYMBYAX Take 1 capsule by mouth every evening.       All past medical history, surgical history, allergies, family history,  immunizations andmedications were updated in the EMR today and reviewed under the history and medication portions of their EMR.     ROS: Negative, with the exception of above mentioned in HPI   Objective:  BP 131/82 (BP Location: Right Arm, Patient Position: Sitting, Cuff Size: Large)   Pulse 89   Temp 97.9 F (36.6 C)   Resp 20   Ht 5\' 4"  (1.626 m)   Wt 180 lb (81.6 kg)   SpO2 97%   BMI 30.90 kg/m  Body mass index is 30.9 kg/m. Gen: Afebrile. No acute distress. Nontoxic. Pleasant caucasian female.  HENT: AT. [place]. Bilateral TM visualized and full, no erythema. MMM. Bilateral nares with erythema Left, swelling bilaterally, drainage present. Throat without erythema or exudates. Cough present.  Eyes:Pupils Equal Round Reactive to light, Extraocular movements intact,  Conjunctiva without  redness, discharge or icterus. Neck/lymp/endocrine: Supple,no lymphadenopathy,  CV: RRR no murmur, no edema, +2/4 P posterior tibialis pulses Chest: CTAB, no wheeze or crackles Abd: Soft. NTND. BS present. no Masses palpated.  Skin: no rashes, purpura or petechiae.  Neuro:  Normal gait. PERLA. EOMi. Alert. Oriented x3  No exam data present No results found. No results found for this or any previous visit (from the past 24 hour(s)).  Assessment/Plan: Valerie Walters is a 37 y.o. female present for OV for  Bronchitis/Acute maxillary sinusitis, recurrence not specified - Agree signs of sinusitis and cough remain. Initial wheezing on exam cleared with duoneb tx.   - Start LQ 500 x 5 days, IM depo medrol today (she has tolerated in the past),  Continue tessalon perles/Mucinex DM, start xyzal (prescribed) nightly, flonase OTC - stop smoking.  - ipratropium-albuterol (DUONEB) 0.5-2.5 (3) MG/3ML nebulizer solution 3 mL - methylPREDNISolone acetate (DEPO-MEDROL) injection 80 mg - levofloxacin (LEVAQUIN) 500 MG tablet; Take 1 tablet (500 mg total) by mouth daily.  Dispense: 5 tablet; Refill: 0 - levocetirizine (XYZAL) 5 MG tablet; Take 1 tablet (5 mg total) by mouth every evening.  Dispense: 30 tablet; Refill: 5 - methylPREDNISolone acetate (DEPO-MEDROL) injection 80 mg  Thrush - new problem for this provider, pt reports present intermittently > 2 years - smoker- reported chronic by her. Try nystatin swish and diflucan, if not resolved will need to refer to specialist to rule out other causes or malignancy.  - fluconazole (DIFLUCAN) 150 MG tablet; Take 1 tablet (150 mg total) by mouth once for 1 dose.  Dispense: 1 tablet; Refill: 0 (in the event of yeast from abx) - nystatin (MYCOSTATIN) 100000 UNIT/ML suspension; Take 5 mLs (500,000 Units total) by mouth 4 (four) times daily. 5 ml swish for 30 seconds and then spit out, QID, do not swallow.  Dispense: 100 mL; Refill: 0 - F/U 2 weeks if not  resolved.    Reviewed expectations re: course of current medical issues.  Discussed self-management of symptoms.  Outlined signs and symptoms indicating need for more acute intervention.  Patient verbalized understanding and all questions were answered.  Patient received an After-Visit Summary.    No orders of the defined types were placed in this encounter.  > 25 minutes spent with patient, >50% of time spent face to face counseling  Note is dictated utilizing voice recognition software. Although note has been proof read prior to signing, occasional typographical errors still can be missed. If any questions arise, please do not hesitate to call for verification.   electronically signed by:  Felix Pacini, DO  [facility] - [place]

## 2018-05-29 NOTE — Patient Instructions (Addendum)
Start xyzal (levocetrizine) at night for at least 4 weeks. Can get OTC if insurance does not pay- antihistamine.   Levaquin- one a day for 5 days, antibiotic.  Diflucan- stake after antibiotic finished if able- yeast  Nystatin swish as label reads for 5 days. If not resolved after 2 weeks--> follow up will want to refer you to specialist.  Try adding flonase nasal spray as well (OTC) daily.  Continue mucinex DM and flonase.    Please help us help you:  [facility]. Below you will find basic instructions that you may need to access in the future. Please help us help you by reading the instructions, which cover many of the frequent questions we experience.   Prescription refills and request:  -In order to allow more efficient response time, please call your pharmacy for all refills. They will forward the request electronically to us. This allows for the quickest possible response. Request left on a nurse line can take longer to refill, since these are checked as time allows between office patients and other phone calls.  - refill request can take up to 3-5 working days to complete.  - If request is sent electronically and request is appropiate, it is usually completed in 1-2 business days.  - all patients will need to be seen routinely for all chronic medical conditions requiring prescription medications (see follow-up below). If you are overdue for follow up on your condition, you will be asked to make an appointment and we will call in enough medication to cover you until your appointment (up to 30 days).  - all controlled substances will require a face to face visit to request/refill.  - if you desire your prescriptions to go through a new pharmacy, and have an active script at original pharmacy, you will need to call your pharmacy and have scripts transferred to new pharmacy. This is completed between the pharmacy locations and not  by your provider.    Results: If any images or labs were ordered, it can take up to 1 week to get results depending on the test ordered and the lab/facility running and resulting the test. - Normal or stable results, which do not need further discussion, may be released to your mychart immediately with attached note to you. A call may not be generated for normal results. Please make certain to sign up for mychart. If you have questions on how to activate your mychart you can call the front office.  - If your results need further discussion, our office will attempt to contact you via phone, and if unable to reach you after 2 attempts, we will release your abnormal result to your mychart with instructions.  - All results will be automatically released in mychart after 1 week.  - Your provider will provide you with explanation and instruction on all relevant material in your results. Please keep in mind, results and labs may appear confusing or abnormal to the untrained eye, but it does not mean they are actually abnormal for you personally. If you have any questions about your results that are not covered, or you desire more detailed explanation than what was provided, you should make an appointment with your provider to do so.   Our office handles many outgoing and incoming calls daily. If we have not contacted you within 1 week about your results, please check your mychart to see if there is a message first  and if not, then contact our office.  In helping with this matter, you help decrease call volume, and therefore allow [place] to be able to respond to patients needs more efficiently.   Acute office visits (sick visit):  An acute visit is intended for a new problem and are scheduled in shorter time slots to allow schedule openings for patients with new problems. This is the appropriate visit to discuss a new problem. Problems will not be addressed by phone call or Echart message. Appointment is needed if  requesting treatment. In order to provide you with excellent quality medical care with proper time for you to explain your problem, have an exam and receive treatment with instructions, these appointments should be limited to one new problem per visit. If you experience a new problem, in which you desire to be addressed, please make an acute office visit, we save openings on the schedule to accommodate you. Please do not save your new problem for any other type of visit, let us take care of it properly and quickly for you.   Follow up visits:  Depending on your condition(s) your provider will need to see you routinely in order to provide you with quality care and prescribe medication(s). Most chronic conditions (Example: hypertension, Diabetes, depression/anxiety... etc), require visits a couple times a year. Your provider will instruct you on proper follow up for your personal medical conditions and history. Please make certain to make follow up appointments for your condition as instructed. Failing to do so could result in lapse in your medication treatment/refills. If you request a refill, and are overdue to be seen on a condition, we will always provide you with a 30 day script (once) to allow you time to schedule.    Medicare wellness (well visit): - we have a wonderful Nurse Selena Batten), that will meet with you and provide you will yearly medicare wellness visits. These visits should occur yearly (can not be scheduled less than 1 calendar year apart) and cover preventive health, immunizations, advance directives and screenings you are entitled to yearly through your medicare benefits. Do not miss out on your entitled benefits, this is when medicare will pay for these benefits to be ordered for you.  These are strongly encouraged by your provider and is the appropriate type of visit to make certain you are up to date with all preventive health benefits. If you have not had your medicare wellness exam in the  last 12 months, please make certain to schedule one by calling the office and schedule your medicare wellness with Selena Batten as soon as possible.   Yearly physical (well visit):  - Adults are recommended to be seen yearly for physicals. Check with your insurance and date of your last physical, most insurances require one calendar year between physicals. Physicals include all preventive health topics, screenings, medical exam and labs that are appropriate for gender/age and history. You may have fasting labs needed at this visit. This is a well visit (not a sick visit), new problems should not be covered during this visit (see acute visit).  - Pediatric patients are seen more frequently when they are younger. Your provider will advise you on well child visit timing that is appropriate for your their age. - This is not a medicare wellness visit. Medicare wellness exams do not have an exam portion to the visit. Some medicare companies allow for a physical, some do not allow a yearly physical. If your medicare allows a yearly physical you can  schedule the medicare wellness with our nurse Selena Batten and have your physical with your provider after, on the same day. Please check with insurance for your full benefits.   Late Policy/No Shows:  - all new patients should arrive 15-30 minutes earlier than appointment to allow [place] time  to  obtain all personal demographics,  insurance information and for you to complete office paperwork. - All established patients should arrive 10-15 minutes earlier than appointment time to update all information and be checked in .  - In our best efforts to run on time, if you are late for your appointment you will be asked to either reschedule or if able, we will work you back into the schedule. There will be a wait time to work you back in the schedule,  depending on availability.  - If you are unable to make it to your appointment as scheduled, please call 24 hours ahead of time to allow [place] to  fill the time slot with someone else who needs to be seen. If you do not cancel your appointment ahead of time, you may be charged a no show fee.

## 2018-06-19 ENCOUNTER — Ambulatory Visit: Payer: Self-pay | Admitting: Psychology

## 2018-07-01 ENCOUNTER — Telehealth: Payer: Self-pay | Admitting: Family Medicine

## 2018-07-01 ENCOUNTER — Telehealth: Payer: Self-pay

## 2018-07-01 DIAGNOSIS — F22 Delusional disorders: Secondary | ICD-10-CM

## 2018-07-01 DIAGNOSIS — F309 Manic episode, unspecified: Secondary | ICD-10-CM

## 2018-07-01 DIAGNOSIS — F418 Other specified anxiety disorders: Secondary | ICD-10-CM

## 2018-07-01 MED ORDER — OLANZAPINE 5 MG PO TABS
5.0000 mg | ORAL_TABLET | Freq: Every day | ORAL | 0 refills | Status: DC
Start: 2018-07-01 — End: 2018-11-20

## 2018-07-01 MED ORDER — FLUOXETINE HCL 20 MG PO TABS: 20.0000 mg | ORAL_TABLET | Freq: Every day | ORAL | 0 refills | Status: DC

## 2018-07-01 MED ORDER — HYDROXYZINE PAMOATE 25 MG PO CAPS: 25.0000 mg | ORAL_CAPSULE | Freq: Every day | ORAL | 2 refills | Status: DC

## 2018-07-01 NOTE — Telephone Encounter (Signed)
Spoke with  Pt. She was under the impression the Dr. Claiborne BillingsKuneff wanted her to see a psychiatrist. Pt would rather not see a psychiatrist and have Dr. Claiborne BillingsKuneff treat her. She would like the Symbyax written as two different medications, states its very expensive currently and when she gets insurance she may try it again. Pt also start back to work today and feels very shaky, not productive, and feels her mind wondering of to bad places. She wants to know if there is something stronger she can take other than the antihistamine she currently takes for immediate relief, she feels it does not work anymore.

## 2018-07-01 NOTE — Telephone Encounter (Signed)
Please verify with pt she wants to see a psychiatrist. If she does we can place a referral for her.  However phone note makes it sound like she "needs" one-- as in requirement, which I personally did not require her to have one--but would be happy to refer her. She is do for follow up here regardless, since we need to refill med at least until she gets in to psych

## 2018-07-01 NOTE — Telephone Encounter (Signed)
Pt notified medication send in. Advised to make an appt or we cannot call anymore refills in. Also advised that Dr. Claiborne Billingskuneff doesn't want to add a new medication in when she hasnt been taking the current medication for the past month, also advised that it can take up to 2wk to a month to feel that the medications are working. Pt voice understand and stated that she will try to make an appt in Jan but unsure if she can due to her just starting a new job.

## 2018-07-01 NOTE — Telephone Encounter (Signed)
REFILLS PROVIDED FOR 30 DAYS on fluoxetine and zyprexa- split meds per pt request for affordability. She does need to followup for refills in january.  If desiring referral to psych will be a=happy to place for her or [facility] if neccess[facility].

## 2018-07-01 NOTE — Telephone Encounter (Signed)
Copied from CRM [id number]. Topic: Quick Communication - See Telephone Encounter >> Jul 01, 2018 11:51 AM Lorrine KinMcGee, Yona Stansbury B, NT wrote: CRM for notification. See Telephone encounter for: 07/01/18. Patient calling and would like to speak with Dr Claiborne BillingsKuneff or her nurse regarding some medications. States that she is at work right now and keeps crying. States that the person that she had seen was the wrong type of doctor and they could not prescribe the medications that she needs. Please advise.  CB#: [phone]

## 2018-07-01 NOTE — Telephone Encounter (Signed)
Pt would like to get a referral to a psychiatrist.So you have someone in mind that you like to refer to?  Copied from CRM [id number]. Topic: Referral - Question >> Jun 28, 2018  1:23 PM Gaynelle AduPoole, Shalonda wrote: Reason for CRM: patient is calling to state she is needing a refferal for a  physiatrist that can do her medictaion change. She stated she likes the OLANZapine-FLUoxetine (SYMBYAX) 3-25 MG capsule but it is to expense.  And the pharmacy advise her that if it possible to send over this medication split OLANZapine and FLUoxetine. She is requesting a call back. Please advise.

## 2018-07-01 NOTE — Telephone Encounter (Signed)
Spoke with pt and advised that she needs to make an appt to see Dr. Claiborne BillingsKuneff for anymore refills. Pt has been off the medication for approx. 2mo. Advised her that it will take 2wk to 1 month, for the medication to get back in her system and hopefully it will help the anxiety and problems that she is having w not being able to concentrate. Pt staed that she will try to make an appt in Jan, but not sure she will be able to take off since she just started a new job. Also advised once again that we will need to see her in Jan before we can give her a refill of this medication.

## 2018-07-15 ENCOUNTER — Ambulatory Visit: Payer: Self-pay | Admitting: *Deleted

## 2018-07-15 NOTE — Telephone Encounter (Signed)
Patient advised that she would need to be seen in order to get any Rx especially since she has not been seen for over a month.  Patient voiced understanding.

## 2018-07-15 NOTE — Telephone Encounter (Signed)
Message from Leafy Ro sent at 07/15/2018 2:41 PM EST   Summary: please advise   Pt saw dr Claiborne Billings on 05-29-18 for a productive cough. Pt now has dry cough. Pt has tried delsym, mucinex , chloraseptic spray and throat lozenges. Pt has started a new job and can not take off until at least her 90 day probation. Pt works in [employer]. [facility] [address]. Please advise

## 2018-07-17 ENCOUNTER — Ambulatory Visit: Payer: 59 | Admitting: Psychology

## 2018-07-29 NOTE — Telephone Encounter (Signed)
Called pt. LM asking what day she needs a letter for work for. Pt was last seen in the office on 05/29/2018. Spoke w pt on 07/01/2018, regarding a medication refill request, in which I told the pt she needed to make an appt to be seen in the office. She did not make an appt as she was advised to do. Pt was told to call back and let us know what date she needs a note for, advised that I can possibly only write a note for the 05/29/2018 appt, since that was her last OV appt and she has not had any other appts since then, but was advised to schedule an appt, but never did so.

## 2018-07-29 NOTE — Telephone Encounter (Signed)
Copied from CRM [id number]. Topic: Quick Communication - See Telephone Encounter >> Jul 29, 2018 10:35 AM Aretta Nip wrote: CRM for notification. See Telephone encounter for: 07/29/18. Needs a letter that was put in her file to return to work faxed to [facility] at [phone] Fax ASAP Work note

## 2018-07-31 NOTE — Telephone Encounter (Signed)
LM for pt to CB if she has questions or concerns regarding the note, that she requested.

## 2018-08-02 NOTE — Telephone Encounter (Signed)
LM for pt to call back if she has any questions or concerns regarding the note she requested.

## 2018-08-23 ENCOUNTER — Ambulatory Visit: Payer: Self-pay

## 2018-08-23 NOTE — Telephone Encounter (Signed)
Spoke with patient and explained we could not RX anything for anxiety or depression without patient being seen for a face to face encounter. Pt stated she has stopped the Prozac and needs something that will not make her gain weight. Pt verbalized understanding and will keep March appt

## 2018-08-23 NOTE — Telephone Encounter (Signed)
Pt. Reports she would like to change from her Prozac to something different because of weight gain. States she has Hydroxyzine for "my nerves or mini panic attacks." States it makes her drowsy during the day. Appointment made for March (at pt. Request because of work) to discuss medications. Would like to know if there is something else she can take for "breakthrough panic" that could be sent to pharmacy before her appointment. Uses [facility] on [address] Please let pt. Know. Reason for Disposition . Caller has NON-URGENT medication question about med that PCP prescribed and triager unable to answer question  Answer Assessment - Initial Assessment Questions 1. SYMPTOMS: "Do you have any symptoms?"     Has had some wt. Gain from Prozac 2. SEVERITY: If symptoms are present, ask "Are they mild, moderate or severe?"     Moderate  Protocols used: MEDICATION QUESTION CALL-A-AH

## 2018-09-11 ENCOUNTER — Encounter: Payer: Self-pay | Admitting: Family Medicine

## 2018-09-11 ENCOUNTER — Ambulatory Visit (INDEPENDENT_AMBULATORY_CARE_PROVIDER_SITE_OTHER): Payer: 59 | Admitting: Family Medicine

## 2018-09-11 VITALS — BP 116/70 | HR 84 | Temp 98.2°F | Resp 16 | Ht 64.0 in

## 2018-09-11 DIAGNOSIS — F22 Delusional disorders: Secondary | ICD-10-CM | POA: Diagnosis not present

## 2018-09-11 DIAGNOSIS — F309 Manic episode, unspecified: Secondary | ICD-10-CM

## 2018-09-11 DIAGNOSIS — F418 Other specified anxiety disorders: Secondary | ICD-10-CM

## 2018-09-11 MED ORDER — VENLAFAXINE HCL ER 37.5 MG PO CP24: ORAL_CAPSULE | ORAL | 0 refills | Status: DC

## 2018-09-11 NOTE — Patient Instructions (Addendum)
In 3 weeks call in and we ill refill for the 75 mg effexor tab- Follow up in 8-10 weeks.   I am glad you are doing well.    I think you will like effexor. Must take it every morning. Dont stop abruptly.    Depression Screening Depression screening is a tool that your health care provider can use to learn if you have symptoms of depression. Depression is a common condition with many symptoms that are also often found in other conditions. Depression is treatable, but it must first be diagnosed. You may not know that certain feelings, thoughts, and behaviors that you are having can be symptoms of depression. Taking a depression screening test can help you and your health care provider decide if you need more assessment, or if you should be referred to a mental health care provider. What are the screening tests?  You may have a physical exam to see if another condition is affecting your mental health. You may have a blood or urine sample taken during the physical exam.  You may be interviewed using a screening tool that was developed from research, such as one of these: ? Patient Health Questionnaire (PHQ). This is a set of either 2 or 9 questions. A health care provider who has been trained to score this screening test uses a guide to assess if your symptoms suggest that you may have depression. ? Hamilton Depression Rating Scale (HAM-D). This is a set of either 17 or 24 questions. You may be asked to take it again during or after your treatment, to see if your depression has gotten better. ? Beck Depression Inventory (BDI). This is a set of 21 multiple choice questions. Your health care provider scores your answers to assess:  Your level of depression, ranging from mild to severe.  Your response to treatment.  Your health care provider may talk with you about your daily activities, such as eating, sleeping, work, and recreation, and ask if you have had any changes in activity.  Your health  care provider may ask you to see a mental health specialist, such as a psychiatrist or psychologist, for more evaluation. Who should be screened for depression?   All adults, including adults with a family history of a mental health disorder.  Adolescents who are 33-24 years old.  People who are recovering from a myocardial infarction (MI).  Pregnant women, or women who have given birth.  People who have a long-term (chronic) illness.  Anyone who has been diagnosed with another type of a mental health disorder.  Anyone who has symptoms that could show depression. What do my results mean? Your health care provider will review the results of your depression screening, physical exam, and lab tests. Positive screens suggest that you may have depression. Screening is the first step in getting the care that you may need. It is up to you to get your screening results. Ask your health care provider, or the department that is doing your screening tests, when your results will be ready. Talk with your health care provider about your results and diagnosis. A diagnosis of depression is made using the Diagnostic and Statistical Manual of Mental Disorders (DSM-V). This is a book that lists the number and type of symptoms that must be present for a health care provider to give a specific diagnosis.  Your health care provider may work with you to treat your symptoms of depression, or your health care provider may help you find a  mental health provider who can assess, diagnose, and treat your depression. Get help right away if:  You have thoughts about hurting yourself or others. If you ever feel like you may hurt yourself or others, or have thoughts about taking your own life, get help right away. You can go to your nearest emergency department or call:  Your local emergency services (911 in the [place]  A suicide crisis helpline, such as the National Suicide Prevention Lifeline at 216-869-7611. This is  open 24 hours a day. Summary  Depression screening is the first step in getting the help that you may need.  If your screening test shows symptoms of depression (is positive), your health care provider may ask you to see a mental health provider.  Anyone who is age 19 or older should be screened for depression. This information is not intended to replace advice given to you by your health care provider. Make sure you discuss any questions you have with your health care provider. Document Released: 11/10/2016 Document Revised: 11/10/2016 Document Reviewed: 11/10/2016 Elsevier Interactive Patient Education  2019 [employer].

## 2018-09-11 NOTE — Progress Notes (Signed)
Patient ID: Valerie Walters, female  DOB: [birth year], 38 y.o.   MRN: [id number] Patient Care Team    Relationship Specialty Notifications Start End  Natalia Leatherwood, DO PCP - General Family Medicine  02/20/18     Chief Complaint  Patient presents with  . Depression    Not fasting. Pt does not like side effects and would like another medication     Subjective:  Valerie Walters is a 38 y.o.  female present for follow up depression/anxiety  Mania (HCC)/Depression with anxiety/Paranoia (HCC) Pt reports she does not like the weight gain side effect of her prozac. She has not taken it in sometime. She is taking the zyprexa most nights. It does make her sleepy and she has to take it before 9 pm or she is too tired in the morning. She does have increased anxiety today, but overall still doing well.  Prior note: Pt reports she is doing better than prior. She has been using the vistaril 1 tab about twice a day. She denies feeling tired after use. She was just able to start [employer] 2 weeks ago,  since it was out of stock. She reports today she has been able to go to the grocery store a few times and did ok. She still has some moments of fear, but overall is "way better." She has yet to hear from psychiatry to set up appt.  Prior note:  Patient and her husband are concerned over her mental health. She reports she was seen by a psychologist once. Per EMR review her prior PCP reports a possible past diagnosis of bipolar disorder. Patient does endorse trial of klonopin, zoloft and Wellbutrin in the past and she never continues medications because they made her feel "foggy". She has been seen int he ED and w/ prior pcp last year for her mental health. She reports panic attacks, heart racing, paranoia after stimulant use. She was vaping and taking unprescribed adderall at the time and caused her to have hallucinations and paranoia. She has noticed since that time caffeine, high sugar meals can also make her  feel anxious.  Currently she reports a fear her husband will want a divorce. She and her husband has had marital problems over the last year and she moved out. They reconciled recently and she moved back in. He is with her today and is supportive of her seeking help. She reports she is fearful to go to the grocery store (or leave house in general) because she thinks people are following her. She denied prior physical abuse on intake form. She admits to going 3-4 days with sleep. When this occurs she has more anxiety and paranoia. She feels her conditioned started to worsen after her fathers death "many years ago."  She endorses past ideations of suicide or just better off not being here, no plans.  Her cmp, cbc and tsh 04/2017 was normal in CE tab.    Depression screen [facility] 2/9 03/18/2018 02/20/2018  Decreased Interest 2 3  Down, Depressed, Hopeless 2 3  PHQ - 2 Score 4 6  Altered sleeping 1 3  Tired, decreased energy 1 3  Change in appetite 0 3  Feeling bad or failure about yourself  3 3  Trouble concentrating 3 3  Moving slowly or fidgety/restless 0 3  Suicidal thoughts 0 1  PHQ-9 Score 12 25  Difficult doing work/chores Somewhat difficult Very difficult   GAD 7 : Generalized Anxiety Score 03/18/2018 02/20/2018  Nervous,  Anxious, on Edge 2 3  Control/stop worrying 3 3  Worry too much - different things 3 3  Trouble relaxing 3 3  Restless 3 3  Easily annoyed or irritable 2 3  Afraid - awful might happen 2 3  Total GAD 7 Score 18 21  Anxiety Difficulty Very difficult Very difficult      No flowsheet data found.  Immunization History  Administered Date(s) Administered  . Influenza,inj,Quad PF,6+ Mos 03/18/2018  . Influenza-Unspecified 05/13/2012  . Tdap 04/12/2003, 07/22/2014    No exam data present  Past Medical History:  Diagnosis Date  . Back pain    CE tab, [facility] system visits.   . Fibromyalgia   . Hordeolum externum (stye)   . Hyperlipidemia   . Iron deficiency   .  Rosacea    No Known Allergies Past Surgical History:  Procedure Laterality Date  . CESAREAN SECTION  2013  . WISDOM TOOTH EXTRACTION     Family History  Problem Relation Age of Onset  . Diabetes Father   . Hyperlipidemia Father   . Heart attack Father   . Prostate cancer Maternal Grandfather        metz to bone  . Heart attack Maternal Grandfather   . Diabetes Paternal Grandmother   . Alcohol abuse Paternal Grandfather   . Heart disease Paternal Grandfather    Social History   Socioeconomic History  . Marital status: Married    Spouse name: Tammy Sours  . Number of children: 2  . Years of education: Not on file  . Highest education level: Not on file  Occupational History  . Occupation: stay at home mother  Social Needs  . Financial resource strain: Not hard at all  . Food insecurity:    Worry: Never true    Inability: Never true  . Transportation needs:    Medical: No    Non-medical: No  Tobacco Use  . Smoking status: Former Smoker    Last attempt to quit: 02/21/2016    Years since quitting: 2.5  . Smokeless tobacco: Never Used  Substance and Sexual Activity  . Alcohol use: Yes    Comment: about twice a year  . Drug use: Not Currently    Comment: had taken adderall notprescribed in the past.   . Sexual activity: Yes    Partners: Male    Comment: marrried  Lifestyle  . Physical activity:    Days per week: 3 days    Minutes per session: 40 min  . Stress: Not on file  Relationships  . Social connections:    Talks on phone: More than three times a week    Gets together: Never    Attends religious service: Never    Active member of club or organization: No    Attends meetings of clubs or organizations: Never    Relationship status: Married  . Intimate partner violence:    Fear of current or ex partner: No    Emotionally abused: No    Physically abused: No    Forced sexual activity: No  Other Topics Concern  . Not on file  Social History Narrative   Marital  status/children/pets: married, 2 children   Education/employment: some college.    Safety:      -Wears a bicycle helmet riding a bike: No     -smoke alarm in the home:Yes     - wears seatbelt: Yes     - Feels safe in their relationships: Yes   Allergies  as of 09/11/2018   No Known Allergies     Medication List       Accurate as of September 11, 2018  9:11 AM. Always use your most recent med list.        levocetirizine 5 MG tablet Commonly known as:  XYZAL Take 1 tablet (5 mg total) by mouth every evening.   OLANZapine 5 MG tablet Commonly known as:  ZYPREXA Take 1 tablet (5 mg total) by mouth at bedtime.       All past medical history, surgical history, allergies, family history, immunizations andmedications were updated in the EMR today and reviewed under the history and medication portions of their EMR.    No results found for this or any previous visit (from the past 2160 hour(s)).  No results found.   ROS: 14 pt review of systems performed and negative (unless mentioned in an HPI)  Objective: BP 116/70 (BP Location: Right Arm, Patient Position: Sitting, Cuff Size: Normal)   Pulse 84   Temp 98.2 F (36.8 C) (Oral)   Resp 16   Ht 5\' 4"  (1.626 m)   LMP 08/28/2018   SpO2 97%   BMI 30.90 kg/m  Gen: Afebrile. No acute distress.  HENT: AT. [place].MMM.  Eyes:Pupils Equal Round Reactive to light, Extraocular movements intact,  Conjunctiva without redness, discharge or icterus. Neuro: Normal gait. PERLA. EOMi. Alert. Oriented x3 . Psych: Normal affect, dress and demeanor. Normal speech. Normal thought content and judgment..   Assessment/plan: Feven Petterson is a 38 y.o. female present for establish care.  Mania (HCC)/Depression with anxiety/Paranoia (HCC) - overall doing well.  - Continue zyprexa nightly - DC prozac- she has not been taking. Does not like the weight gain.  Past medication tried per pt and EMR: zoloft, Wellbutrin, klonopin, seroquel, prozac (weight gain) -  Consider low dose ativan if not improved- would maximize SSRI first - TSH- normal - Ambulatory referral to Psychiatry had been provided. - start effexor taper  - f/u 8 weeks (she will call in 3 weeks to get 90 day script for effexor 75 mg after tapering)  Return in about 8 weeks (around 11/06/2018) for anxiety/depression.   Note is dictated utilizing voice recognition software. Although note has been proof read prior to signing, occasional typographical errors still can be missed. If any questions arise, please do not hesitate to call for verification.  Electronically signed by: Felix Pacini, DO [facility] [place]

## 2018-09-30 ENCOUNTER — Other Ambulatory Visit: Payer: Self-pay

## 2018-09-30 ENCOUNTER — Encounter: Payer: Self-pay | Admitting: Family Medicine

## 2018-09-30 ENCOUNTER — Ambulatory Visit (INDEPENDENT_AMBULATORY_CARE_PROVIDER_SITE_OTHER): Payer: 59 | Admitting: Family Medicine

## 2018-09-30 VITALS — BP 124/79 | HR 92 | Temp 98.0°F | Resp 16 | Ht 64.0 in | Wt 189.5 lb

## 2018-09-30 DIAGNOSIS — F418 Other specified anxiety disorders: Secondary | ICD-10-CM

## 2018-09-30 DIAGNOSIS — F309 Manic episode, unspecified: Secondary | ICD-10-CM | POA: Diagnosis not present

## 2018-09-30 DIAGNOSIS — F22 Delusional disorders: Secondary | ICD-10-CM | POA: Diagnosis not present

## 2018-09-30 MED ORDER — DIAZEPAM 5 MG PO TABS: 5.0000 mg | ORAL_TABLET | Freq: Two times a day (BID) | ORAL | 1 refills | Status: DC | PRN

## 2018-09-30 MED ORDER — VENLAFAXINE HCL ER 75 MG PO CP24: 75.0000 mg | ORAL_CAPSULE | Freq: Every day | ORAL | 0 refills | Status: DC

## 2018-09-30 NOTE — Progress Notes (Signed)
Patient ID: Valerie Walters, female  DOB: [birth year], 38 y.o.   MRN: [id number] Patient Care Team    Relationship Specialty Notifications Start End  Natalia Leatherwood, DO PCP - General Family Medicine  02/20/18     Chief Complaint  Patient presents with  . Cough    Cough since October. Pt states it is from past nasal drip. Pt continues allergy med and flonase   . Stress    Pt is having stress, nausea, upset daily due to job and [employer] virus and caring for patients. Pt states she is just now able to go to the grocery store    Subjective:  Valerie Walters is a 38 y.o.  female present for follow up depression/anxiety  Mania (HCC)/Depression with anxiety/Paranoia [facility]) Patient presents today with increased anxiety.  She was present the beginning of the month and her medications were altered.  She was to taper up on the Effexor to 75 mg daily, she misunderstood the directions and she is only been taking 37.5 mg.  She states she does feel some difference with even just 37.5 mg, but her symptoms were not controlled.  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 Prior note: Pt reports she does not like the weight gain side effect of her prozac. She has not taken it in sometime. She is taking the zyprexa most nights. It does make her sleepy and she has to take it before 9 pm or she is too tired in the morning. She does have increased anxiety today, but overall still doing well.  Prior note: Pt reports she is doing better than prior. She has been using the vistaril 1 tab about twice a day. She denies feeling tired after use. She was just able to start [employer] 2 weeks ago,  since it was out of stock. She reports today she has been able to go to the grocery store a few times and did ok. She still has some moments of fear, but overall is "way better." She has yet to hear from psychiatry to set up appt.  Prior note:  Patient and her  husband are concerned over her mental health. She reports she was seen by a psychologist once. Per EMR review her prior PCP reports a possible past diagnosis of bipolar disorder. Patient does endorse trial of klonopin, zoloft and Wellbutrin in the past and she never continues medications because they made her feel "foggy". She has been seen int he ED and w/ prior pcp last year for her mental health. She reports panic attacks, heart racing, paranoia after stimulant use. She was vaping and taking unprescribed adderall at the time and caused her to have hallucinations and paranoia. She has noticed since that time caffeine, high sugar meals can also make her feel anxious.  Currently she reports a fear her husband will want a divorce. She and her husband has had marital problems over the last year and she moved out. They reconciled recently and she moved back in. He is with her today and is supportive of her seeking help. She reports she is fearful to go to the grocery store (or leave house in general) because she thinks people are following her. She denied prior physical abuse on intake form. She admits to going 3-4 days with sleep. When this occurs she has more anxiety and paranoia. She feels her conditioned started to worsen after her fathers death "many years ago."  She endorses past ideations of suicide or just better off not being here, no plans.  Her cmp, cbc and tsh 04/2017 was normal in CE tab.    Depression screen [facility] 2/9 09/30/2018 03/18/2018 02/20/2018  Decreased Interest 2 2 3   Down, Depressed, Hopeless 2 2 3   PHQ - 2 Score 4 4 6   Altered sleeping 2 1 3   Tired, decreased energy 3 1 3   Change in appetite 3 0 3  Feeling bad or failure about yourself  2 3 3   Trouble concentrating 3 3 3   Moving slowly or fidgety/restless 3 0 3  Suicidal thoughts 1 0 1  PHQ-9 Score 21 12 25   Difficult doing work/chores Extremely dIfficult Somewhat difficult Very difficult   GAD 7 : Generalized Anxiety Score  09/30/2018 03/18/2018 02/20/2018  Nervous, Anxious, on Edge 3 2 3   Control/stop worrying 3 3 3   Worry too much - different things 3 3 3   Trouble relaxing 3 3 3   Restless 3 3 3   Easily annoyed or irritable 3 2 3   Afraid - awful might happen 3 2 3   Total GAD 7 Score 21 18 21   Anxiety Difficulty Extremely difficult Very difficult Very difficult      No flowsheet data found.  Immunization History  Administered Date(s) Administered  . Influenza,inj,Quad PF,6+ Mos 03/18/2018  . Influenza-Unspecified 05/13/2012  . Tdap 04/12/2003, 07/22/2014    No exam data present  Past Medical History:  Diagnosis Date  . Back pain    CE tab, [facility] system visits.   . Fibromyalgia   . Hordeolum externum (stye)   . Hyperlipidemia   . Iron deficiency   . Rosacea    No Known Allergies Past Surgical History:  Procedure Laterality Date  . CESAREAN SECTION  2013  . WISDOM TOOTH EXTRACTION     Family History  Problem Relation Age of Onset  . Diabetes Father   . Hyperlipidemia Father   . Heart attack Father   . Prostate cancer Maternal Grandfather        metz to bone  . Heart attack Maternal Grandfather   . Diabetes Paternal Grandmother   . Alcohol abuse Paternal Grandfather   . Heart disease Paternal Grandfather    Social History   Socioeconomic History  . Marital status: Married    Spouse name: Valerie Walters  . Number of children: 2  . Years of education: Not on file  . Highest education level: Not on file  Occupational History  . Occupation: stay at home mother  Social Needs  . Financial resource strain: Not hard at all  . Food insecurity:    Worry: Never true    Inability: Never true  . Transportation needs:    Medical: No    Non-medical: No  Tobacco Use  . Smoking status: Former Smoker    Last attempt to quit: 02/21/2016    Years since quitting: 2.6  . Smokeless tobacco: Never Used  Substance and Sexual Activity  . Alcohol use: Yes    Comment: about twice a year  . Drug use: Not  Currently    Comment: had taken adderall notprescribed in the past.   . Sexual activity: Yes    Partners: Male    Comment: marrried  Lifestyle  . Physical activity:    Days per week: 3 days    Minutes per session: 40 min  . Stress: Not on file  Relationships  . Social connections:    Talks on phone: More than three times  a week    Gets together: Never    Attends religious service: Never    Active member of club or organization: No    Attends meetings of clubs or organizations: Never    Relationship status: Married  . Intimate partner violence:    Fear of current or ex partner: No    Emotionally abused: No    Physically abused: No    Forced sexual activity: No  Other Topics Concern  . Not on file  Social History Narrative   Marital status/children/pets: married, 2 children   Education/employment: some college.    Safety:      -Wears a bicycle helmet riding a bike: No     -smoke alarm in the home:Yes     - wears seatbelt: Yes     - Feels safe in their relationships: Yes   Allergies as of 09/30/2018   No Known Allergies     Medication List       Accurate as of September 30, 2018 12:03 PM. Always use your most recent med list.        diazepam 5 MG tablet Commonly known as:  Valium Take 1 tablet (5 mg total) by mouth every 12 (twelve) hours as needed for anxiety.   levocetirizine 5 MG tablet Commonly known as:  Xyzal Take 1 tablet (5 mg total) by mouth every evening.   OLANZapine 5 MG tablet Commonly known as:  ZyPREXA Take 1 tablet (5 mg total) by mouth at bedtime.   venlafaxine XR 75 MG 24 hr capsule Commonly known as:  EFFEXOR-XR Take 1 capsule (75 mg total) by mouth daily with breakfast.       All past medical history, surgical history, allergies, family history, immunizations andmedications were updated in the EMR today and reviewed under the history and medication portions of their EMR.    No results found for this or any previous visit (from the past 2160  hour(s)).  No results found.   ROS: 14 pt review of systems performed and negative (unless mentioned in an HPI)  Objective: BP 124/79 (BP Location: Left Arm, Patient Position: Sitting, Cuff Size: Normal)   Pulse 92   Temp 98 F (36.7 C) (Oral)   Resp 16   Ht  (1.626 m)   Wt 189 lb 8 oz (86 kg)   LMP 09/16/2018 (Exact Date)   SpO2 97%   BMI 32.53 kg/m  Gen: Afebrile. No acute distress.  Nontoxic.  Pleasant Caucasian female. HENT: AT. [place].  MMM.  Eyes:Pupils Equal Round Reactive to light, Extraocular movements intact,  Conjunctiva without redness, discharge or icterus. Psych: Moderately anxious, otherwise normal affect, dress and demeanor. Normal speech. Normal thought content and judgment.   Assessment/plan: Norinne Jeane is a 38 y.o. female present for establish care.  Mania (HCC)/Depression with anxiety/Paranoia (HCC) -Increased anxiety, may be situational surrounding coronavirus versus uncontrolled depression and anxiety secondary to switching medications and her not tapering appropriately. - Continue effexor at 75 mg dose (was only taking 37.5mg ) - Continue zyprexa nightly -She is "scared "to try Ativan.  Agreeable to Valium 5 mg twice daily as needed-Short term -Medications tried in the past: Prozac- weight gain.  zoloft, Wellbutrin, klonopin, seroquel, prozac (weight gain) - TSH- normal - Ambulatory referral to Psychiatry had been provided. - pt wants an excuse explaining to her anxiety to her employment. - f/u 10 weeks   Return in about 10 weeks (around 12/09/2018) for [facility].   Note is dictated utilizing voice recognition  software. Although note has been proof read prior to signing, occasional typographical errors still can be missed. If any questions arise, please do not hesitate to call for verification.  Electronically signed by: Howard Pouch, DO [place]

## 2018-09-30 NOTE — Patient Instructions (Addendum)
- Continue effexor at 75 mg dose  - Continue zyprexa nightly  Valium prescribed for every 12 hours as needed- temporarily.   F/U 10 weeks, sooner if symptoms worsen.      Generalized Anxiety Disorder, Adult Generalized anxiety disorder (GAD) is a mental health disorder. People with this condition constantly worry about everyday events. Unlike normal anxiety, worry related to GAD is not triggered by a specific event. These worries also do not fade or get better with time. GAD interferes with life functions, including relationships, work, and school. GAD can vary from mild to severe. People with severe GAD can have intense waves of anxiety with physical symptoms (panic attacks). What are the causes? The exact cause of GAD is not known. What increases the risk? This condition is more likely to develop in:  Women.  People who have a family history of anxiety disorders.  People who are very shy.  People who experience very stressful life events, such as the death of a loved one.  People who have a very stressful family environment. What are the signs or symptoms? People with GAD often worry excessively about many things in their lives, such as their health and family. They may also be overly concerned about:  Doing well at work.  Being on time.  Natural disasters.  Friendships. Physical symptoms of GAD include:  Fatigue.  Muscle tension or having muscle twitches.  Trembling or feeling shaky.  Being easily startled.  Feeling like your heart is pounding or racing.  Feeling out of breath or like you cannot take a deep breath.  Having trouble falling asleep or staying asleep.  Sweating.  Nausea, diarrhea, or irritable bowel syndrome (IBS).  Headaches.  Trouble concentrating or remembering facts.  Restlessness.  Irritability. How is this diagnosed? Your health care provider can diagnose GAD based on your symptoms and medical history. You will also have a  physical exam. The health care provider will ask specific questions about your symptoms, including how severe they are, when they started, and if they come and go. Your health care provider may ask you about your use of alcohol or drugs, including prescription medicines. Your health care provider may refer you to a mental health specialist for further evaluation. Your health care provider will do a thorough examination and may perform additional tests to rule out other possible causes of your symptoms. To be diagnosed with GAD, a person must have anxiety that:  Is out of his or her control.  Affects several different aspects of his or her life, such as work and relationships.  Causes distress that makes him or her unable to take part in normal activities.  Includes at least three physical symptoms of GAD, such as restlessness, fatigue, trouble concentrating, irritability, muscle tension, or sleep problems. Before your health care provider can confirm a diagnosis of GAD, these symptoms must be present more days than they are not, and they must last for six months or longer. How is this treated? The following therapies are usually used to treat GAD:  Medicine. Antidepressant medicine is usually prescribed for long-term daily control. Antianxiety medicines may be added in severe cases, especially when panic attacks occur.  Talk therapy (psychotherapy). Certain types of talk therapy can be helpful in treating GAD by providing support, education, and guidance. Options include: ? Cognitive behavioral therapy (CBT). People learn coping skills and techniques to ease their anxiety. They learn to identify unrealistic or negative thoughts and behaviors and to replace them with positive  ones. ? Acceptance and commitment therapy (ACT). This treatment teaches people how to be mindful as a way to cope with unwanted thoughts and feelings. ? Biofeedback. This process trains you to manage your body's response  (physiological response) through breathing techniques and relaxation methods. You will work with a therapist while machines are used to monitor your physical symptoms.  Stress management techniques. These include yoga, meditation, and exercise. A mental health specialist can help determine which treatment is best for you. Some people see improvement with one type of therapy. However, other people require a combination of therapies. Follow these instructions at home:  Take over-the-counter and prescription medicines only as told by your health care provider.  Try to maintain a normal routine.  Try to anticipate stressful situations and allow extra time to manage them.  Practice any stress management or self-calming techniques as taught by your health care provider.  Do not punish yourself for setbacks or for not making progress.  Try to recognize your accomplishments, even if they are small.  Keep all follow-up visits as told by your health care provider. This is important. Contact a health care provider if:  Your symptoms do not get better.  Your symptoms get worse.  You have signs of depression, such as: ? A persistently sad, cranky, or irritable mood. ? Loss of enjoyment in activities that used to bring you joy. ? Change in weight or eating. ? Changes in sleeping habits. ? Avoiding friends or family members. ? Loss of energy for normal tasks. ? Feelings of guilt or worthlessness. Get help right away if:  You have serious thoughts about hurting yourself or others. If you ever feel like you may hurt yourself or others, or have thoughts about taking your own life, get help right away. You can go to your nearest emergency department or call:  Your local emergency services (911 in the [place]  A suicide crisis helpline, such as the National Suicide Prevention Lifeline at (618)180-0125. This is open 24 hours a day. Summary  Generalized anxiety disorder (GAD) is a mental health  disorder that involves worry that is not triggered by a specific event.  People with GAD often worry excessively about many things in their lives, such as their health and family.  GAD may cause physical symptoms such as restlessness, trouble concentrating, sleep problems, frequent sweating, nausea, diarrhea, headaches, and trembling or muscle twitching.  A mental health specialist can help determine which treatment is best for you. Some people see improvement with one type of therapy. However, other people require a combination of therapies. This information is not intended to replace advice given to you by your health care provider. Make sure you discuss any questions you have with your health care provider. Document Released: 10/21/2012 Document Revised: 05/16/2016 Document Reviewed: 05/16/2016 Elsevier Interactive Patient Education  2019 [employer].

## 2018-10-07 ENCOUNTER — Encounter: Payer: Self-pay | Admitting: Family Medicine

## 2018-10-08 ENCOUNTER — Other Ambulatory Visit: Payer: Self-pay

## 2018-10-08 ENCOUNTER — Ambulatory Visit (INDEPENDENT_AMBULATORY_CARE_PROVIDER_SITE_OTHER): Payer: 59 | Admitting: Family Medicine

## 2018-10-08 ENCOUNTER — Encounter: Payer: Self-pay | Admitting: Family Medicine

## 2018-10-08 DIAGNOSIS — R05 Cough: Secondary | ICD-10-CM | POA: Diagnosis not present

## 2018-10-08 DIAGNOSIS — R059 Cough, unspecified: Secondary | ICD-10-CM

## 2018-10-08 MED ORDER — BENZONATATE 200 MG PO CAPS: 200.0000 mg | ORAL_CAPSULE | Freq: Two times a day (BID) | ORAL | 0 refills | Status: DC | PRN

## 2018-10-08 MED ORDER — DM-GUAIFENESIN ER 30-600 MG PO TB12: 1.0000 | ORAL_TABLET | Freq: Two times a day (BID) | ORAL | 0 refills | Status: DC

## 2018-10-08 MED ORDER — FLUTICASONE PROPIONATE 50 MCG/ACT NA SUSP: 2.0000 | Freq: Every day | NASAL | 6 refills | Status: DC

## 2018-10-08 MED ORDER — IPRATROPIUM BROMIDE 0.06 % NA SOLN: 2.0000 | Freq: Four times a day (QID) | NASAL | 12 refills | Status: DC

## 2018-10-08 MED ORDER — OMEPRAZOLE 40 MG PO CPDR: 40.0000 mg | DELAYED_RELEASE_CAPSULE | Freq: Every day | ORAL | 2 refills | Status: DC

## 2018-10-08 NOTE — Progress Notes (Signed)
   Virtual Visit via Video   I connected with Lenore Manner on 10/08/18 at 10:30 AM EDT by a video enabled telemedicine application and verified that I am speaking with the correct person using two identifiers. Location patient: Home Location provider: [facility], Office Persons participating in the virtual visit: Patient, Dr. Claiborne Billings, R.Baker, LPN  I discussed the limitations of evaluation and management by telemedicine and the availability of in person appointments. The patient expressed understanding and agreed to proceed.  Subjective:   Chief Complaint  Patient presents with  . Cough    Dry/productive Cough since October. Not getting better. Keeping patient up at night.    HPI:  Patient has had a persistent cough since October 2019 after a bronchitis episode.  Reports the [employer] initially did help, however not as much lately.  Did start the Mucinex early on with the cough, but has not been using it routinely now.  Is using Flonase and Xyzal daily.  She is also using cough drops.  States her chest is getting sore from coughing.  The cough is worse at night and keeping her up, although it does occur throughout the day.  She has gained weight over the last few months.  She did have reflux with her pregnancy. He denies any fever, chills, nausea, vomit or sinus pressure.  She denies any COVID-19 exposure.  ROS: See pertinent positives and negatives per HPI.  Patient Active Problem List   Diagnosis Date Noted  . Depression with anxiety 02/20/2018  . Paranoia (HCC) 02/20/2018  . Mania (HCC) 02/20/2018    Social History   Tobacco Use  . Smoking status: Former Smoker    Last attempt to quit: 02/21/2016    Years since quitting: 2.6  . Smokeless tobacco: Never Used  Substance Use Topics  . Alcohol use: Yes    Comment: about twice a year    Current Outpatient Medications:  .  diazepam (VALIUM) 5 MG tablet, Take 1 tablet (5 mg total) by mouth every 12 (twelve) hours as  needed for anxiety., Disp: 60 tablet, Rfl: 1 .  levocetirizine (XYZAL) 5 MG tablet, Take 1 tablet (5 mg total) by mouth every evening., Disp: 30 tablet, Rfl: 5 .  OLANZapine (ZYPREXA) 5 MG tablet, Take 1 tablet (5 mg total) by mouth at bedtime., Disp: 30 tablet, Rfl: 0 .  venlafaxine XR (EFFEXOR-XR) 75 MG 24 hr capsule, Take 1 capsule (75 mg total) by mouth daily with breakfast., Disp: 90 capsule, Rfl: 0  No Known Allergies  Objective:   Gen: No acute distress. Nontoxic in appearance.  HENT: AT. [place].  MMM.  Eyes: Conjunctiva without redness, discharge or icterus. Chest: Cough,  Neuro: Alert. Oriented x3    Assessment and Plan:  Cough in adult - persistent cough since October - continue xyzal QHS - Continue Flonase and tessalon perles prescribed for her today - Start omeprazole 40 mg QD- weight loss encouraged, small frequent meals and avoid laying flat at least 3-4 hours after meals.  - start atrovent nasal spray- prescribed - Prescribed mucinex DM - f/u 4 weeks if not improved.    > 15 minutes spent with patient, >50% of time spent face to face counseling    Felix Pacini, DO 10/08/2018

## 2018-10-08 NOTE — Patient Instructions (Signed)
telehealth

## 2018-10-11 ENCOUNTER — Encounter: Payer: Self-pay | Admitting: Family Medicine

## 2018-10-11 ENCOUNTER — Telehealth: Payer: Self-pay | Admitting: Family Medicine

## 2018-10-11 MED ORDER — GABAPENTIN 300 MG PO CAPS: 300.0000 mg | ORAL_CAPSULE | Freq: Every day | ORAL | 1 refills | Status: DC

## 2018-10-11 NOTE — Telephone Encounter (Signed)
Patients My Chart Message: Hello Dr. Claiborne Billings Last night was another night of me not being able to sleep due to this cough. I've tried everything, even sitting up in bed trying to sleep but I'm still coughing. But less and it coming up so now it's almost becoming a dry painful cough. PLEASE any advice would be greatly appreciated. I don't know how much more of this I can take. I'm at work now and can barely keep my eyes open from being so tired. Thank you Valerie Walters   Please advise

## 2018-10-11 NOTE — Telephone Encounter (Signed)
Please inform patient the following information:  Called in gabapentin to be used before bed. This is really the last med to try, outside of a controlled substance and that is not ideal.

## 2018-10-11 NOTE — Telephone Encounter (Signed)
Called pt and left message on VM explaining medication being called in. Okay per DPR.

## 2018-11-20 ENCOUNTER — Other Ambulatory Visit: Payer: Self-pay

## 2018-11-20 ENCOUNTER — Ambulatory Visit (INDEPENDENT_AMBULATORY_CARE_PROVIDER_SITE_OTHER): Payer: 59 | Admitting: Family Medicine

## 2018-11-20 ENCOUNTER — Encounter: Payer: Self-pay | Admitting: Family Medicine

## 2018-11-20 VITALS — Ht 64.0 in | Wt 185.0 lb

## 2018-11-20 DIAGNOSIS — R5383 Other fatigue: Secondary | ICD-10-CM

## 2018-11-20 DIAGNOSIS — R51 Headache: Secondary | ICD-10-CM

## 2018-11-20 DIAGNOSIS — R42 Dizziness and giddiness: Secondary | ICD-10-CM

## 2018-11-20 DIAGNOSIS — R410 Disorientation, unspecified: Secondary | ICD-10-CM | POA: Diagnosis not present

## 2018-11-20 DIAGNOSIS — R519 Headache, unspecified: Secondary | ICD-10-CM

## 2018-11-20 NOTE — Patient Instructions (Signed)
Dizziness Dizziness is a common problem. It makes you feel unsteady or light-headed. You may feel like you are about to pass out (faint). Dizziness can lead to getting hurt if you stumble or fall. Dizziness can be caused by many things, including:  Medicines.  Not having enough water in your body (dehydration).  Illness. Follow these instructions at home: Eating and drinking   Drink enough fluid to keep your pee (urine) clear or pale yellow. This helps to keep you from getting dehydrated. Try to drink more clear fluids, such as water.  Do not drink alcohol.  Limit how much caffeine you drink or eat, if your doctor tells you to do that.  Limit how much salt (sodium) you drink or eat, if your doctor tells you to do that. Activity   Avoid making quick movements. ? When you stand up from sitting in a chair, steady yourself until you feel okay. ? In the morning, first sit up on the side of the bed. When you feel okay, stand slowly while you hold onto something. Do this until you know that your balance is fine.  If you need to stand in one place for a long time, move your legs often. Tighten and relax the muscles in your legs while you are standing.  Do not drive or use heavy machinery if you feel dizzy.  Avoid bending down if you feel dizzy. Place items in your home so you can reach them easily without leaning over. Lifestyle  Do not use any products that contain nicotine or tobacco, such as cigarettes and e-cigarettes. If you need help quitting, ask your doctor.  Try to lower your stress level. You can do this by using methods such as yoga or meditation. Talk with your doctor if you need help. General instructions  Watch your dizziness for any changes.  Take over-the-counter and prescription medicines only as told by your doctor. Talk with your doctor if you think that you are dizzy because of a medicine that you are taking.  Tell a friend or a family member that you are feeling  dizzy. If he or she notices any changes in your behavior, have this person call your doctor.  Keep all follow-up visits as told by your doctor. This is important. Contact a doctor if:  Your dizziness does not go away.  Your dizziness or light-headedness gets worse.  You feel sick to your stomach (nauseous).  You have trouble hearing.  You have new symptoms.  You are unsteady on your feet.  You feel like the room is spinning. Get help right away if:  You throw up (vomit) or have watery poop (diarrhea), and you cannot eat or drink anything.  You have trouble: ? Talking. ? Walking. ? Swallowing. ? Using your arms, hands, or legs.  You feel generally weak.  You are not thinking clearly, or you have trouble forming sentences. A friend or family member may notice this.  You have: ? Chest pain. ? Pain in your belly (abdomen). ? Shortness of breath. ? Sweating.  Your vision changes.  You are bleeding.  You have a very bad headache.  You have neck pain or a stiff neck.  You have a fever. These symptoms may be an emergency. Do not wait to see if the symptoms will go away. Get medical help right away. Call your local emergency services (911 in the [place] Do not drive yourself to the hospital. Summary  Dizziness makes you feel unsteady or light-headed. You  may feel like you are about to pass out (faint).  Drink enough fluid to keep your pee (urine) clear or pale yellow. Do not drink alcohol.  Avoid making quick movements if you feel dizzy.  Watch your dizziness for any changes. This information is not intended to replace advice given to you by your health care provider. Make sure you discuss any questions you have with your health care provider. Document Released: 06/15/2011 Document Revised: 07/13/2016 Document Reviewed: 07/13/2016 Elsevier Interactive Patient Education  2019 [employer].  Confusion Confusion is the inability to think with the usual speed or  clarity. People who are confused often describe their thinking as cloudy or unclear. Confusion can also include feeling disoriented. This means you are unaware of where you are or who you are. You may also not know the date or time. When confused, you may have difficulty remembering, paying attention, or making decisions. Some people also act aggressively when they are confused. In some cases, confusion may come on quickly. In other cases, it may develop slowly over time. How quickly confusion comes on depends on the cause. Confusion may be caused by:  Head injury (concussion).  Seizures.  Stroke.  Fever.  Brain tumor.  Decrease in brain function due to a vascular or neurologic condition (dementia).  Emotions, like rage or terror.  Inability to know what is real and what is not (hallucinations).  Infections, such as a urinary tract infection (UTI).  Using too much alcohol, drugs, or medicines.  Loss of fluid (dehydration) or an imbalance of salts in the body (electrolytes).  Lack of sleep.  Low blood sugar (diabetes).  Low levels of oxygen. This comes from conditions such as chronic lung disorders.  Side effects of medicines, or taking medicines that affect other medicines (drug interactions).  Lack of certain nutrients, especially niacin, thiamine, vitamin C, or vitamin B.  Sudden drop in body temperature (hypothermia).  Change in routine, such as traveling or being hospitalized. Follow these instructions at home: Pay attention to your symptoms. Tell your health care provider about any changes or if you develop new symptoms. Follow these instructions to control or treat symptoms. Ask a family member or friend for help if needed. Medicines  Take over-the-counter and prescription medicines only as told by your health care provider.  Ask your health care provider about changing or stopping any medicines that may be causing your confusion.  Avoid pain medicines or sleep  medicines until you have fully recovered.  Use a pillbox or an alarm to help you take the right medicines at the right time. Lifestyle   Eat a balanced diet that includes fruits and vegetables.  Get enough sleep. For most adults, this is 7-9 hours each night.  Do not drink alcohol.  Do not become isolated. Spend time with other people and make plans for your days.  Do not drive until your health care provider says that it is safe to do so.  Do not use any products that contain nicotine or tobacco, such as cigarettes and e-cigarettes. If you need help quitting, ask your health care provider.  Stop other activities that may increase your chances of getting hurt. These may include some work duties, sports activities, swimming, or bike riding. Ask your health care provider what activities are safe for you. What caregivers can do  Find out if the person is confused. Ask the person to state his or her name, age, and the date. If the person is unsure or answers  incorrectly, he or she may be confused.  Always introduce yourself, no matter how well the person knows you.  Remind the person of his or her location. Do this often.  Place a calendar and clock near the person who is confused.  Talk about current events and plans for the day.  Keep the environment calm, quiet, and peaceful.  Help the person do the things that he or she is unable to do. These include: ? Taking medicines. ? Keeping follow-up visits with his or her health care provider. ? Helping with household duties, including meal preparation. ? Running errands.  Get help if you need it. There are several support groups for caregivers.  If the person you are helping needs more support, consider day care, extended care programs, or a skilled nursing facility. The person's health care provider may be able to help evaluate these options. General instructions  Monitor yourself for any conditions you may have. These may  include: ? Checking your blood glucose levels, if you have diabetes. ? Watching your weight, if you are overweight. ? Monitoring your blood pressure, if you have hypertension. ? Monitoring your body temperature, if you have a fever.  Keep all follow-up visits as told by your health care provider. This is important. Contact a health care provider if:  Your symptoms get worse. Get help right away if you:  Feel that you are not able to care for yourself.  Develop severe headaches, repeated vomiting, seizures, blackouts, or slurred speech.  Have increasing confusion, weakness, numbness, restlessness, or personality changes.  Develop a loss of balance, have marked dizziness, feel uncoordinated, or fall.  Develop severe anxiety, or you have delusions or hallucinations. These symptoms may represent a serious problem that is an emergency. Do not wait to see if the symptoms will go away. Get medical help right away. Call your local emergency services (911 in the [place] Do not drive yourself to the hospital. Summary  Confusion is the inability to think with the usual speed or clarity. People who are confused often describe their thinking as cloudy or unclear.  Confusion can also include having difficulty remembering, paying attention, or making decisions.  Confusion may come on quickly or develop slowly over time, depending on the cause. There are many different causes of confusion.  Ask for help from family members or friends if you are unable to take care of yourself. This information is not intended to replace advice given to you by your health care provider. Make sure you discuss any questions you have with your health care provider. Document Released: 08/03/2004 Document Revised: 06/28/2017 Document Reviewed: 06/28/2017 Elsevier Interactive Patient Education  2019 [employer]

## 2018-11-20 NOTE — Progress Notes (Signed)
VIRTUAL VISIT VIA VIDEO  I connected with Valerie Walters on 11/20/18 at  2:40 PM EDT by a video enabled telemedicine application and verified that I am speaking with the correct person using two identifiers. Location patient: Home [facility] provider: [facility] Persons participating in the virtual visit: Patient, Dr. Claiborne BillingsKuneff and R.Baker, LPN  I discussed the limitations of evaluation and management by telemedicine and the availability of in person appointments. The patient expressed understanding and agreed to proceed.   SUBJECTIVE Chief Complaint  Patient presents with  . Dizziness    Pt states the symptoms have been going on since Friday. She also says her "brain hurts" like it is swollen   . Altered Mental Status    Pt says she cannot remember things, getting things backwards    HPI: Valerie Walters is a 38 y.o. female resents today via virtual video visit to discuss her 6 day story of having dizziness which is occurring all throughout the day without relief.  She reports her "brain hurts " and she feels like her "brain is swollen ."  This head pressure feeling is on the right side of her head only.  She denies it.  She states this does not feel like a headache it feels like pressure.  She states she, "does not feel safe to drive.  "She endorses having confusion or decreased alertness stating she cannot remember to play tasks, "getting things backwards ", meant to turn left in her car-but then turned right accidentally.  She states she feels sleepy and irritated.  She denies fever, chills, nausea, vomit, rash, cough, shortness of breath, dysuria or bowel changes.  She reports she is eating and drinking well.  She denies any added medications or supplements prior to onset of symptoms.  She denies any exposure to those with illnesses.  She denies any insect bites.  History of depression with anxiety, mania and paranoia.  She reports compliance with Effexor 75 mg daily and Valium 5  mg twice daily as needed when needed only.  She had never started the gabapentin, she states she forgot that was prescribed for her.  ROS: See pertinent positives and negatives per HPI.  Patient Active Problem List   Diagnosis Date Noted  . Depression with anxiety 02/20/2018  . Paranoia (HCC) 02/20/2018  . Mania (HCC) 02/20/2018    Social History   Tobacco Use  . Smoking status: Former Smoker    Last attempt to quit: 02/21/2016    Years since quitting: 2.7  . Smokeless tobacco: Never Used  Substance Use Topics  . Alcohol use: Yes    Comment: about twice a year    Current Outpatient Medications:  .  diazepam (VALIUM) 5 MG tablet, Take 1 tablet (5 mg total) by mouth every 12 (twelve) hours as needed for anxiety., Disp: 60 tablet, Rfl: 1 .  OLANZapine (ZYPREXA) 5 MG tablet, Take 1 tablet (5 mg total) by mouth at bedtime., Disp: 30 tablet, Rfl: 0 .  omeprazole (PRILOSEC) 40 MG capsule, Take 1 capsule (40 mg total) by mouth daily., Disp: 30 capsule, Rfl: 2 .  venlafaxine XR (EFFEXOR-XR) 75 MG 24 hr capsule, Take 1 capsule (75 mg total) by mouth daily with breakfast., Disp: 90 capsule, Rfl: 0 .  benzonatate (TESSALON) 200 MG capsule, Take 1 capsule (200 mg total) by mouth 2 (two) times daily as needed for cough. (Patient not taking: Reported on 11/20/2018), Disp: 60 capsule, Rfl: 0 .  dextromethorphan-guaiFENesin (MUCINEX DM) 30-600 MG  12hr tablet, Take 1 tablet by mouth 2 (two) times daily. (Patient not taking: Reported on 11/20/2018), Disp: 60 tablet, Rfl: 0 .  fluticasone (FLONASE) 50 MCG/ACT nasal spray, Place 2 sprays into both nostrils daily. (Patient not taking: Reported on 11/20/2018), Disp: 16 g, Rfl: 6 .  gabapentin (NEURONTIN) 300 MG capsule, Take 1 capsule (300 mg total) by mouth at bedtime. (Patient not taking: Reported on 11/20/2018), Disp: 30 capsule, Rfl: 1 .  ipratropium (ATROVENT) 0.06 % nasal spray, Place 2 sprays into both nostrils 4 (four) times daily. (Patient not taking:  Reported on 11/20/2018), Disp: 15 mL, Rfl: 12 .  levocetirizine (XYZAL) 5 MG tablet, Take 1 tablet (5 mg total) by mouth every evening. (Patient not taking: Reported on 11/20/2018), Disp: 30 tablet, Rfl: 5  No Known Allergies  OBJECTIVE: Ht 5\' 4"  (1.626 m)   Wt 185 lb (83.9 kg)   LMP 11/06/2018 (Exact Date)   BMI 31.76 kg/m  Gen: No acute distress. Nontoxic in appearance. She does appear pale.  HENT: AT. [place].  MMM.  Eyes:Pupils Equal Round Reactive to light, Extraocular movements intact,  Conjunctiva without redness, discharge or icterus. CV: no edema Chest: Cough or shortness of breath not presnt Skin: no rashes, purpura or petechiae.  Neuro:  Alert. Oriented x3  Psych: Normal affect, dress and demeanor. Normal speech. Normal thought content and judgment.  ASSESSMENT AND PLAN: Valerie Walters is a 38 y.o. female present for  - no obvious cause of her symptoms. No recent illness, no nausea or vomit.  - Advised pt she should be seen in ED with symptoms of continued dizziness for 6 days, right sided head pressure, decreased memory and alertness. Those are concerning symptoms that require urgent labs and possibly imaging- which we can not provide her here currently. She refused to go to ED.  - Offered her labs here tomorrow, however she reports understanding this is not the recommend route for her to pursue. She wants labs here tomorrow and this has been arranged. She is aware these will not be resulted until Friday at the earliest.  - I can provide her a work excuse for 3 days- which is standard for an acute illness. If labs indicate need for additional days off we can discuss at that time or if she does end up going to  the ED they can provide excuse for her.  Hydrate.  - Pt to report to ED if symptoms worsen headache, fever,  chills, nausea or vomit occur.    > 25 minutes spent with patient, >50% of time spent face to face counseling and coordinating care.     Felix Pacini, DO 11/20/2018

## 2018-11-21 ENCOUNTER — Other Ambulatory Visit (INDEPENDENT_AMBULATORY_CARE_PROVIDER_SITE_OTHER): Payer: 59

## 2018-11-21 DIAGNOSIS — F418 Other specified anxiety disorders: Secondary | ICD-10-CM

## 2018-11-21 DIAGNOSIS — F22 Delusional disorders: Secondary | ICD-10-CM

## 2018-11-21 DIAGNOSIS — F309 Manic episode, unspecified: Secondary | ICD-10-CM

## 2018-11-21 LAB — CBC
HCT: 39.6 % (ref 36.0–46.0)
Hemoglobin: 12.9 g/dL (ref 12.0–15.0)
MCHC: 32.7 g/dL (ref 30.0–36.0)
MCV: 88.3 fl (ref 78.0–100.0)
Platelets: 423 10*3/uL — ABNORMAL HIGH (ref 150.0–400.0)
RBC: 4.48 Mil/uL (ref 3.87–5.11)
RDW: 14.3 % (ref 11.5–15.5)
WBC: 13.2 10*3/uL — ABNORMAL HIGH (ref 4.0–10.5)

## 2018-11-21 LAB — COMPREHENSIVE METABOLIC PANEL
ALT: 10 U/L (ref 0–35)
AST: 14 U/L (ref 0–37)
Albumin: 4.1 g/dL (ref 3.5–5.2)
Alkaline Phosphatase: 56 U/L (ref 39–117)
BUN: 10 mg/dL (ref 6–23)
CO2: 27 mEq/L (ref 19–32)
Calcium: 9.5 mg/dL (ref 8.4–10.5)
Chloride: 106 mEq/L (ref 96–112)
Creatinine, Ser: 0.74 mg/dL (ref 0.40–1.20)
GFR: 88.16 mL/min (ref >60.00)
Glucose, Bld: 85 mg/dL (ref 70–99)
Potassium: 4.2 mEq/L (ref 3.5–5.1)
Sodium: 140 mEq/L (ref 135–145)
Total Bilirubin: 0.3 mg/dL (ref 0.2–1.2)
Total Protein: 6.7 g/dL (ref 6.0–8.3)

## 2018-11-21 LAB — SEDIMENTATION RATE: Sed Rate: 37 mm/hr — ABNORMAL HIGH (ref 0–20)

## 2018-11-21 LAB — TSH: TSH: 0.44 u[IU]/mL (ref 0.35–4.50)

## 2018-11-21 LAB — C-REACTIVE PROTEIN: CRP: 1.2 mg/dL (ref 0.5–20.0)

## 2018-11-21 NOTE — Addendum Note (Signed)
Addended by: Daryel November L on: 11/21/2018 11:43 AM   Modules accepted: Orders

## 2018-11-22 ENCOUNTER — Telehealth: Payer: Self-pay | Admitting: Family Medicine

## 2018-11-22 MED ORDER — CEPHALEXIN 500 MG PO CAPS: 500.0000 mg | ORAL_CAPSULE | Freq: Three times a day (TID) | ORAL | 0 refills | Status: DC

## 2018-11-22 NOTE — Telephone Encounter (Signed)
Please inform patient the following information: Her labs indicate a possible infection with a mild elevation in white count. Source may be UTI by initial results. Will not know for certain until culture returns- which will not be until Monday.  - I have called in keflex for treatment of UTI. If she is feeling improved since we spoke, she can wait to start med until she hears from [place]. If she is not improved start med now.

## 2018-11-22 NOTE — Telephone Encounter (Signed)
Pt was called and detailed results/instructions were left on VM, okay per DPR. Pt was given number to return call with any questions

## 2018-11-23 LAB — URINE CULTURE
MICRO NUMBER:: 478587
SPECIMEN QUALITY:: ADEQUATE

## 2018-11-23 LAB — URINALYSIS W MICROSCOPIC + REFLEX CULTURE
Bilirubin Urine: NEGATIVE
Glucose, UA: NEGATIVE
Hgb urine dipstick: NEGATIVE
Hyaline Cast: NONE SEEN /LPF
Ketones, ur: NEGATIVE
Nitrites, Initial: NEGATIVE
Protein, ur: NEGATIVE
Specific Gravity, Urine: 1.017 (ref 1.001–1.03)
Squamous Epithelial / LPF: >=28 /HPF — AB (ref <=5)
pH: 8 (ref 5.0–8.0)

## 2018-11-23 LAB — CULTURE INDICATED

## 2018-11-27 ENCOUNTER — Telehealth: Payer: Self-pay

## 2018-11-27 NOTE — Telephone Encounter (Signed)
Received refill request for patients Valium and Olanzapine. Pt was called, detailed message was left on VM letting pt know to call back to schedule F/U appt.  Last appt was 09/30/2018 and pt was to F/U in 10 weeks.

## 2018-12-03 ENCOUNTER — Encounter: Payer: Self-pay | Admitting: Family Medicine

## 2018-12-04 NOTE — Telephone Encounter (Signed)
Tried to call pt, LM to call back or to check my chart

## 2018-12-06 ENCOUNTER — Ambulatory Visit (INDEPENDENT_AMBULATORY_CARE_PROVIDER_SITE_OTHER): Payer: 59 | Admitting: Family Medicine

## 2018-12-06 ENCOUNTER — Encounter: Payer: Self-pay | Admitting: Family Medicine

## 2018-12-06 ENCOUNTER — Other Ambulatory Visit: Payer: Self-pay

## 2018-12-06 VITALS — Ht 64.0 in

## 2018-12-06 DIAGNOSIS — F309 Manic episode, unspecified: Secondary | ICD-10-CM | POA: Diagnosis not present

## 2018-12-06 DIAGNOSIS — F418 Other specified anxiety disorders: Secondary | ICD-10-CM | POA: Diagnosis not present

## 2018-12-06 DIAGNOSIS — F22 Delusional disorders: Secondary | ICD-10-CM | POA: Diagnosis not present

## 2018-12-06 MED ORDER — OLANZAPINE 5 MG PO TABS: 5.0000 mg | ORAL_TABLET | Freq: Every evening | ORAL | 1 refills | Status: DC | PRN

## 2018-12-06 MED ORDER — VENLAFAXINE HCL ER 150 MG PO CP24: 150.0000 mg | ORAL_CAPSULE | Freq: Every day | ORAL | 0 refills | Status: DC

## 2018-12-06 MED ORDER — HYDROXYZINE PAMOATE 100 MG PO CAPS: 100.0000 mg | ORAL_CAPSULE | Freq: Three times a day (TID) | ORAL | 1 refills | Status: DC | PRN
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
## 2018-12-11 ENCOUNTER — Encounter: Payer: Self-pay | Admitting: Family Medicine

## 2018-12-11 ENCOUNTER — Telehealth: Payer: Self-pay | Admitting: Family Medicine

## 2018-12-11 MED ORDER — METRONIDAZOLE 500 MG PO TABS: 500.0000 mg | ORAL_TABLET | Freq: Two times a day (BID) | ORAL | 0 refills | Status: DC

## 2018-12-11 NOTE — Telephone Encounter (Signed)
I will call in flagyl this once. Will not be able to continue providing treatment without visit. If symptoms do not resolve she will need to be seen by her GYN for a pelvic exam.

## 2018-12-11 NOTE — Telephone Encounter (Signed)
Pt was sent mychart message

## 2018-12-11 NOTE — Telephone Encounter (Signed)
Pt sent the following my chart message:  Good morning Dr. Claiborne Billings  I have been experiencing unpleasant feelings around my lady area.. so i took the tablet you prescribed me for yeast infections and it is still not feeling well. I took the pill 5 days ago. Plus my husband lost his job last week and I do not have insurance yet through my job. I've experienced this before and it was BV can you PLEASEsend me a prescription to my pharmacy.I would really appreciate it, very uncomfortable. Thank you Valerie Walters  Please advise

## 2019-02-05 ENCOUNTER — Ambulatory Visit (INDEPENDENT_AMBULATORY_CARE_PROVIDER_SITE_OTHER): Payer: Managed Care, Other (non HMO) | Admitting: Family Medicine

## 2019-02-05 ENCOUNTER — Other Ambulatory Visit: Payer: Self-pay

## 2019-02-05 ENCOUNTER — Encounter: Payer: Self-pay | Admitting: Family Medicine

## 2019-02-05 DIAGNOSIS — F418 Other specified anxiety disorders: Secondary | ICD-10-CM

## 2019-02-05 DIAGNOSIS — G894 Chronic pain syndrome: Secondary | ICD-10-CM | POA: Insufficient documentation

## 2019-02-05 DIAGNOSIS — E669 Obesity, unspecified: Secondary | ICD-10-CM | POA: Diagnosis not present

## 2019-02-05 DIAGNOSIS — F309 Manic episode, unspecified: Secondary | ICD-10-CM

## 2019-02-05 DIAGNOSIS — M791 Myalgia, unspecified site: Secondary | ICD-10-CM | POA: Insufficient documentation

## 2019-02-05 DIAGNOSIS — M542 Cervicalgia: Secondary | ICD-10-CM | POA: Insufficient documentation

## 2019-02-05 DIAGNOSIS — F22 Delusional disorders: Secondary | ICD-10-CM | POA: Diagnosis not present

## 2019-02-05 MED ORDER — VENLAFAXINE HCL ER 150 MG PO CP24: 150.0000 mg | ORAL_CAPSULE | Freq: Every day | ORAL | 1 refills | Status: DC

## 2019-02-05 MED ORDER — GABAPENTIN 300 MG PO CAPS: 300.0000 mg | ORAL_CAPSULE | Freq: Every day | ORAL | 1 refills | Status: DC

## 2019-02-05 MED ORDER — OMEPRAZOLE 40 MG PO CPDR: 40.0000 mg | DELAYED_RELEASE_CAPSULE | Freq: Every day | ORAL | 2 refills | Status: DC

## 2019-02-05 MED ORDER — OLANZAPINE 5 MG PO TABS: 5.0000 mg | ORAL_TABLET | Freq: Every evening | ORAL | 1 refills | Status: DC | PRN

## 2019-02-05 MED ORDER — HYDROXYZINE PAMOATE 100 MG PO CAPS: 100.0000 mg | ORAL_CAPSULE | Freq: Three times a day (TID) | ORAL | 1 refills | Status: DC | PRN
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
## 2019-02-07 ENCOUNTER — Encounter: Payer: Self-pay | Admitting: Family Medicine

## 2019-02-07 NOTE — Telephone Encounter (Signed)
Pt wrote the following my chart message:Hi Dr. Raoul Pitch,  I am going to try and get FMLA only because I have missed so many days at work due to my anxiety and depression that I'm scared I will get fired. I will probably need you to fill out a form if thats okay. Its only for job protection incase I miss any more days. Im not trying to get off of work or anything, it's just for emergencies my supervisor is the one who recommended I do this.  Thank you Valerie Walters   Pt was written back and told to send paperwork and Dr Raoul Pitch would review and make a decision.

## 2019-02-11 ENCOUNTER — Encounter: Payer: Self-pay | Admitting: Family Medicine

## 2019-02-11 MED ORDER — VENLAFAXINE HCL ER 75 MG PO CP24: 75.0000 mg | ORAL_CAPSULE | Freq: Every day | ORAL | 0 refills | Status: DC

## 2019-02-11 NOTE — Telephone Encounter (Signed)
Pt wrote the following my chart message: Hello Dr. Raoul Walters, I think I'd like to increase the Effexor. Can you send the new rx to [facility] in [place].  Thank you  Valerie Walters   In your last note (seen 07/29)  it states she wants to stay on same dose and may decide later to increase but you also sent referral to psych. Would you like for her to make an appt for this increase or are you planning on Psych taking over medications. Please advise.   Thanks

## 2019-02-11 NOTE — Telephone Encounter (Signed)
I have called the addition of Effexor 75 mg into her pharmacy.  She is to take the Effexor 150 mg and the Effexor 75 mg together.  This equals the next dose of 225 mg/day.  Follow-up in 6 months as long as doing well.

## 2019-02-12 ENCOUNTER — Other Ambulatory Visit: Payer: Self-pay | Admitting: General Practice

## 2019-02-12 MED ORDER — VENLAFAXINE HCL ER 75 MG PO CP24: 75.0000 mg | ORAL_CAPSULE | Freq: Every day | ORAL | 0 refills | Status: DC

## 2019-02-12 MED ORDER — VENLAFAXINE HCL ER 150 MG PO CP24: 150.0000 mg | ORAL_CAPSULE | Freq: Every day | ORAL | 1 refills | Status: DC

## 2019-02-13 ENCOUNTER — Encounter: Payer: Self-pay | Admitting: Family Medicine

## 2019-02-17 ENCOUNTER — Encounter: Payer: Self-pay | Admitting: Family Medicine

## 2019-06-30 ENCOUNTER — Encounter: Payer: Self-pay | Admitting: Family Medicine

## 2019-07-06 ENCOUNTER — Other Ambulatory Visit: Payer: Self-pay

## 2019-07-06 ENCOUNTER — Encounter (HOSPITAL_COMMUNITY): Payer: Self-pay

## 2019-07-06 ENCOUNTER — Emergency Department (HOSPITAL_COMMUNITY): Payer: Self-pay

## 2019-07-06 ENCOUNTER — Emergency Department (HOSPITAL_COMMUNITY)
Admission: EM | Admit: 2019-07-06 | Discharge: 2019-07-07 | Disposition: A | Discharge status: Home / Self Care | Payer: Self-pay | Attending: Emergency Medicine | Admitting: Emergency Medicine

## 2019-07-06 DIAGNOSIS — F3010 Manic episode without psychotic symptoms, unspecified: Secondary | ICD-10-CM

## 2019-07-06 DIAGNOSIS — F22 Delusional disorders: Secondary | ICD-10-CM | POA: Insufficient documentation

## 2019-07-06 DIAGNOSIS — F15259 Other stimulant dependence with stimulant-induced psychotic disorder, unspecified: Secondary | ICD-10-CM | POA: Insufficient documentation

## 2019-07-06 DIAGNOSIS — Z79899 Other long term (current) drug therapy: Secondary | ICD-10-CM | POA: Insufficient documentation

## 2019-07-06 DIAGNOSIS — E876 Hypokalemia: Secondary | ICD-10-CM | POA: Insufficient documentation

## 2019-07-06 DIAGNOSIS — Z87891 Personal history of nicotine dependence: Secondary | ICD-10-CM | POA: Insufficient documentation

## 2019-07-06 DIAGNOSIS — F1520 Other stimulant dependence, uncomplicated: Secondary | ICD-10-CM | POA: Insufficient documentation

## 2019-07-06 DIAGNOSIS — F1510 Other stimulant abuse, uncomplicated: Secondary | ICD-10-CM

## 2019-07-06 LAB — CBC WITH DIFFERENTIAL/PLATELET
Abs Immature Granulocytes: 0.05 10*3/uL (ref 0.00–0.07)
Basophils Absolute: 0.1 10*3/uL (ref 0.0–0.1)
Basophils Relative: 1 %
Eosinophils Absolute: 0.7 10*3/uL — ABNORMAL HIGH (ref 0.0–0.5)
Eosinophils Relative: 5 %
HCT: 38.8 % (ref 36.0–46.0)
Hemoglobin: 12.1 g/dL (ref 12.0–15.0)
Immature Granulocytes: 0 %
Lymphocytes Relative: 25 %
Lymphs Abs: 3.4 10*3/uL (ref 0.7–4.0)
MCH: 27 pg (ref 26.0–34.0)
MCHC: 31.2 g/dL (ref 30.0–36.0)
MCV: 86.6 fL (ref 80.0–100.0)
Monocytes Absolute: 1 10*3/uL (ref 0.1–1.0)
Monocytes Relative: 7 %
Neutro Abs: 8.7 10*3/uL — ABNORMAL HIGH (ref 1.7–7.7)
Neutrophils Relative %: 62 %
Platelets: 416 10*3/uL — ABNORMAL HIGH (ref 150–400)
RBC: 4.48 MIL/uL (ref 3.87–5.11)
RDW: 15.6 % — ABNORMAL HIGH (ref 11.5–15.5)
WBC: 13.9 10*3/uL — ABNORMAL HIGH (ref 4.0–10.5)
nRBC: 0 % (ref 0.0–0.2)

## 2019-07-06 LAB — COMPREHENSIVE METABOLIC PANEL
ALT: 21 U/L (ref 0–44)
AST: 21 U/L (ref 15–41)
Albumin: 4.4 g/dL (ref 3.5–5.0)
Alkaline Phosphatase: 55 U/L (ref 38–126)
Anion gap: 11 (ref 5–15)
BUN: 11 mg/dL (ref 6–20)
CO2: 24 mmol/L (ref 22–32)
Calcium: 9.2 mg/dL (ref 8.9–10.3)
Chloride: 104 mmol/L (ref 98–111)
Creatinine, Ser: 0.6 mg/dL (ref 0.44–1.00)
GFR calc Af Amer: >60 mL/min (ref >60)
GFR calc non Af Amer: >60 mL/min (ref >60)
Glucose, Bld: 100 mg/dL — ABNORMAL HIGH (ref 70–99)
Potassium: 2.9 mmol/L — ABNORMAL LOW (ref 3.5–5.1)
Sodium: 139 mmol/L (ref 135–145)
Total Bilirubin: 0.9 mg/dL (ref 0.3–1.2)
Total Protein: 7.6 g/dL (ref 6.5–8.1)

## 2019-07-06 LAB — ETHANOL: Alcohol, Ethyl (B): <10 mg/dL (ref <10)

## 2019-07-06 LAB — ACETAMINOPHEN LEVEL: Acetaminophen (Tylenol), Serum: <10 ug/mL — ABNORMAL LOW (ref 10–30)

## 2019-07-06 LAB — SALICYLATE LEVEL: Salicylate Lvl: <7 mg/dL — ABNORMAL LOW (ref 7.0–30.0)

## 2019-07-06 MED ORDER — HYDROXYZINE HCL 25 MG PO TABS
25.0000 mg | ORAL_TABLET | Freq: Once | ORAL | Status: AC
  Administered 2019-07-06: 25 mg via ORAL
  Filled 2019-07-06: qty 1

## 2019-07-06 MED ORDER — POTASSIUM CHLORIDE CRYS ER 20 MEQ PO TBCR
40.0000 meq | EXTENDED_RELEASE_TABLET | Freq: Once | ORAL | Status: AC
  Administered 2019-07-06: 40 meq via ORAL
  Filled 2019-07-06: qty 2

## 2019-07-06 MED ORDER — ALUM & MAG HYDROXIDE-SIMETH 200-200-20 MG/5ML PO SUSP: 30.0000 mL | Freq: Four times a day (QID) | ORAL | Status: DC | PRN

## 2019-07-06 MED ORDER — ACETAMINOPHEN 325 MG PO TABS: 650.0000 mg | ORAL_TABLET | ORAL | Status: DC | PRN

## 2019-07-06 MED ORDER — ONDANSETRON HCL 4 MG PO TABS: 4.0000 mg | ORAL_TABLET | Freq: Three times a day (TID) | ORAL | Status: DC | PRN

## 2019-07-06 MED ORDER — NICOTINE 21 MG/24HR TD PT24
21.0000 mg | MEDICATED_PATCH | Freq: Every day | TRANSDERMAL | Status: DC
  Filled 2019-07-06: qty 1
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
## 2019-07-07 MED ORDER — GABAPENTIN 300 MG PO CAPS
300.0000 mg | ORAL_CAPSULE | Freq: Once | ORAL | Status: AC
  Administered 2019-07-07: 300 mg via ORAL
  Filled 2019-07-07: qty 1

## 2019-07-07 NOTE — ED Notes (Signed)
Husband Taneah Masri # [phone]

## 2019-07-07 NOTE — Discharge Instructions (Signed)
Discharge summary:  You were seen in the ER after reporting to [place] that you used methamphetamines yesterday.  You were kept under observation overnight and seen by our psychiatry team this morning.  At this time, we do not feel that you need inpatient hospitalization.  However, you very likely need counseling for your use of drugs.  Using meth can cause paranoia, hearing voices, seeing things, and dangerous behavior.  It is absolutely important that you STOP using this drug, to prevent further harm to yourself or potentially others.  You should NEVER drive a car while using drugs.  If you ever feel suicidal, homicidal, or have any other emergency concerns, you can always come back to the Emergency Department.  Finally, your bloodwork showed that your potassium level was a little low today (2.9).  This is NOT related to your current symptoms, and was an incidental finding.  I included a list of foods that can help improve your potassium.  Please try to include this into your daily diet, and make sure your doctor rechecks your potassium level in 2 weeks.
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
## 2019-07-08 ENCOUNTER — Ambulatory Visit: Payer: Managed Care, Other (non HMO) | Admitting: Family Medicine

## 2019-08-16 ENCOUNTER — Inpatient Hospital Stay (HOSPITAL_COMMUNITY)
Admission: RE | Admit: 2019-08-16 | Discharge: 2019-08-19 | DRG: 897 | Disposition: A | Discharge status: Home / Self Care | Payer: Federal, State, Local not specified - Other | Attending: Psychiatry | Admitting: Psychiatry

## 2019-08-16 ENCOUNTER — Encounter (HOSPITAL_COMMUNITY): Payer: Self-pay | Admitting: Nurse Practitioner

## 2019-08-16 DIAGNOSIS — Z8349 Family history of other endocrine, nutritional and metabolic diseases: Secondary | ICD-10-CM

## 2019-08-16 DIAGNOSIS — Z833 Family history of diabetes mellitus: Secondary | ICD-10-CM

## 2019-08-16 DIAGNOSIS — R45851 Suicidal ideations: Secondary | ICD-10-CM | POA: Diagnosis present

## 2019-08-16 DIAGNOSIS — Z20822 Contact with and (suspected) exposure to covid-19: Secondary | ICD-10-CM | POA: Diagnosis present

## 2019-08-16 DIAGNOSIS — F15150 Other stimulant abuse with stimulant-induced psychotic disorder with delusions: Secondary | ICD-10-CM | POA: Diagnosis present

## 2019-08-16 DIAGNOSIS — F1514 Other stimulant abuse with stimulant-induced mood disorder: Principal | ICD-10-CM | POA: Diagnosis present

## 2019-08-16 DIAGNOSIS — F1594 Other stimulant use, unspecified with stimulant-induced mood disorder: Secondary | ICD-10-CM | POA: Diagnosis present

## 2019-08-16 DIAGNOSIS — Z8249 Family history of ischemic heart disease and other diseases of the circulatory system: Secondary | ICD-10-CM

## 2019-08-16 MED ORDER — ACETAMINOPHEN 325 MG PO TABS
650.0000 mg | ORAL_TABLET | Freq: Four times a day (QID) | ORAL | Status: DC | PRN
  Administered 2019-08-17: 650 mg via ORAL
  Filled 2019-08-16: qty 2

## 2019-08-16 MED ORDER — PANTOPRAZOLE SODIUM 40 MG PO TBEC
40.0000 mg | DELAYED_RELEASE_TABLET | Freq: Every day | ORAL | Status: DC
  Administered 2019-08-17 – 2019-08-19 (×3): 40 mg via ORAL
  Filled 2019-08-16: qty 2
  Filled 2019-08-16 (×4): qty 1

## 2019-08-16 MED ORDER — HYDROXYZINE HCL 25 MG PO TABS: 25.0000 mg | ORAL_TABLET | Freq: Three times a day (TID) | ORAL | Status: DC | PRN

## 2019-08-16 MED ORDER — OLANZAPINE 5 MG PO TABS
5.0000 mg | ORAL_TABLET | Freq: Once | ORAL | Status: AC
  Administered 2019-08-16: 5 mg via ORAL
  Filled 2019-08-16: qty 1

## 2019-08-16 MED ORDER — LORAZEPAM 1 MG PO TABS
1.0000 mg | ORAL_TABLET | Freq: Once | ORAL | Status: AC
  Administered 2019-08-16: 1 mg via ORAL
  Filled 2019-08-16: qty 2

## 2019-08-16 MED ORDER — ALUM & MAG HYDROXIDE-SIMETH 200-200-20 MG/5ML PO SUSP: 30.0000 mL | ORAL | Status: DC | PRN

## 2019-08-16 MED ORDER — MAGNESIUM HYDROXIDE 400 MG/5ML PO SUSP: 30.0000 mL | Freq: Every day | ORAL | Status: DC | PRN

## 2019-08-16 MED ORDER — GABAPENTIN 300 MG PO CAPS
300.0000 mg | ORAL_CAPSULE | Freq: Every day | ORAL | Status: DC
  Administered 2019-08-17: 300 mg via ORAL
  Filled 2019-08-16 (×2): qty 1

## 2019-08-16 NOTE — BH Assessment (Signed)
Assessment Note  Valerie Walters is an 39 y.o. married female who presents to [place] via Event organiser after being petitioned for involuntary commitment by Curator D. Alsiyao ([id number]) [phone]. Affidavit and petition states: "Responding officers arrived with the respondent admitting that she wanted to kill herself. She has access to a firearm which she discharged today into the ground. Has been know to take meth, crack and marijuana. Is a danger to herself and others."  Pt reports she and her husband have been having marital problems. She says he spends all his time at his mother's home, which is close to their home, and he is avoiding her. Pt says she and her husband are both using methamphetamines and it makes her paranoid. She says she believes her husband is swapping out her jewelry with cheap replicas and that he is extracting copper from the house. She suspects he is also hiding another woman at his mother's house, although she has no evidence for this. She say last night she went into the woods and was contemplating killing herself. She says she also saw people that she wasn't sure were real. She says tonight she wanted to leave again but lost her car key. She says she and her 50 year old daughter had and argument and Pt was upset. She reports "I just thought fuck everything and I got the gun and fired it into the ground." Pt's daughter called Event organiser.  Pt acknowledges suicidal ideation. She denies history of suicide attempts. She denies problems with sleep but says her appetite is erratic. Pt describes her mood as labile with sadness, irritability and anxiety. She denies history of suicide attempts. Pt denies any history of intentional self-injurious behaviors. Pt denies current homicidal ideation or history of violence.   Pt reports she started using methamphetamines approximately six months ago and used earlier today. She says she has a history of abusing pain medications and  using cocaine. She says she uses alcohol infrequently. She also reports occasional use of MDMA.  Pt reports she lives with her husband and their two children, age 78 and 43. She says she is unemployed. She says she had a court date 07/30/19 in [place] for a speeding ticket but missed it. She says she receives outpatient medication management with Dr. Howard Pouch, DO. She denies any history of inpatient psychiatric treatment.  Pt is casually dressed, alert and oriented x4. Pt speaks in a clear tone, at moderate volume and rapid pace. Motor behavior appears restless. Eye contact is good. Pt's mood is anxious and affect is labile. Thought process is coherent and at times circumstantial. There is no indication Pt is currently responding to internal stimuli but appears to have possible paranoid thought content. Pt states she wants to be sent to a "spa in [place]" where she can be away from her family and work on her problems.   Diagnosis:  F31.4 Bipolar I disorder, Current or most recent episode depressed, Severe F15.20 Amphetamine-type substance use disorder, Severe  Past Medical History:  Past Medical History:  Diagnosis Date  . Back pain    CE tab, [facility] system visits.   . Fibromyalgia   . Hordeolum externum (stye)   . Hyperlipidemia   . Iron deficiency   . Rosacea     Past Surgical History:  Procedure Laterality Date  . CESAREAN SECTION  2013  . WISDOM TOOTH EXTRACTION      Family History:  Family History  Problem Relation Age of Onset  .  Diabetes Father   . Hyperlipidemia Father   . Heart attack Father   . Prostate cancer Maternal Grandfather        metz to bone  . Heart attack Maternal Grandfather   . Diabetes Paternal Grandmother   . Alcohol abuse Paternal Grandfather   . Heart disease Paternal Grandfather     Social History:  reports that she quit smoking about 3 years ago. She has never used smokeless tobacco. She reports current alcohol use. She reports  current drug use. Drug: Methamphetamines.  Additional Social History:  Alcohol / Drug Use Pain Medications: Pt reports history of using pain pills Prescriptions: Pt denies abuse Over the Counter: Pt denies abuse History of alcohol / drug use?: Yes Longest period of sobriety (when/how long): Unknown Substance #1 Name of Substance 1: Methamphetamines 1 - Age of First Use: 38 1 - Amount (size/oz): 1 gram 1 - Frequency: 3-4 times per month 1 - Duration: Ongoing 1 - Last Use / Amount: 08/16/19 Substance #2 Name of Substance 2: Marijuana 2 - Age of First Use: Adolescent 2 - Amount (size/oz): 1 joint 2 - Frequency: 1-2 times per month 2 - Duration: Ongoing 2 - Last Use / Amount: 08/13/19  CIWA: CIWA-Ar BP: (!) 139/107 Pulse Rate: 95 COWS:    Allergies: No Known Allergies  Home Medications:  Medications Prior to Admission  Medication Sig Dispense Refill  . gabapentin (NEURONTIN) 300 MG capsule Take 1 capsule (300 mg total) by mouth at bedtime. 90 capsule 1  . hydrOXYzine (VISTARIL) 100 MG capsule Take 1 capsule (100 mg total) by mouth 3 (three) times daily as needed for itching. 90 capsule 1  . OLANZapine (ZYPREXA) 5 MG tablet Take 1 tablet (5 mg total) by mouth at bedtime as needed. 90 tablet 1  . omeprazole (PRILOSEC) 40 MG capsule Take 1 capsule (40 mg total) by mouth daily. 30 capsule 2  . venlafaxine XR (EFFEXOR XR) 75 MG 24 hr capsule Take 1 capsule (75 mg total) by mouth daily with breakfast. 90 capsule 0  . venlafaxine XR (EFFEXOR-XR) 150 MG 24 hr capsule Take 1 capsule (150 mg total) by mouth daily with breakfast. 90 capsule 1    OB/GYN Status:  No LMP recorded.  General Assessment Data Location of Assessment: [facility] TTS Assessment: In system Is this a Tele or Face-to-Face Assessment?: Face-to-Face Is this an Initial Assessment or a Re-assessment for this encounter?: Initial Assessment Patient Accompanied by:: Other(Law enforcement) Language Other  than English: No Living Arrangements: Other (Comment)(Lives with husband and 2 children (17, 7)) What gender do you identify as?: Female Marital status: Married [place] name: NA Pregnancy Status: No Living Arrangements: Spouse/significant other, Children Can pt return to current living arrangement?: Yes Admission Status: Involuntary Petitioner: Police Is patient capable of signing voluntary admission?: Yes Referral Source: Other(Law enforcement) Insurance type: Biomedical scientist Exam ([facility] Walk-in ONLY) Medical Exam completed: Yes(Jason Allyson Sabal, FNP)  Crisis Care Plan Living Arrangements: Spouse/significant other, Children Legal Guardian: (Self) Name of Psychiatrist: none Name of Therapist: None  Education Status Is patient currently in school?: No Is the patient employed, unemployed or receiving disability?: Unemployed  Risk to self with the past 6 months Suicidal Ideation: Yes-Currently Present Has patient been a risk to self within the past 6 months prior to admission? : Yes Suicidal Intent: Yes-Currently Present Has patient had any suicidal intent within the past 6 months prior to admission? : Yes Is patient at risk for suicide?: Yes Suicidal Plan?:  Yes-Currently Present Has patient had any suicidal plan within the past 6 months prior to admission? : Yes Specify Current Suicidal Plan: Pt verbalized SI and fired loaded gun Access to Means: Yes Specify Access to Suicidal Means: Access to gun What has been your use of drugs/alcohol within the last 12 months?: Pt reports using methamphetamines Previous Attempts/Gestures: No How many times?: 0 Other Self Harm Risks: None Triggers for Past Attempts: None known Intentional Self Injurious Behavior: None Family Suicide History: No Recent stressful life event(s): Conflict (Comment)(Conflicts with husband) Persecutory voices/beliefs?: Yes Depression: Yes Depression Symptoms: Despondent, Isolating, Loss of interest in  usual pleasures, Feeling angry/irritable, Feeling worthless/self pity Substance abuse history and/or treatment for substance abuse?: No Suicide prevention information given to non-admitted patients: Not applicable  Risk to Others within the past 6 months Homicidal Ideation: No Does patient have any lifetime risk of violence toward others beyond the six months prior to admission? : No Thoughts of Harm to Others: No Current Homicidal Intent: No Current Homicidal Plan: No Access to Homicidal Means: No Identified Victim: None History of harm to others?: No Assessment of Violence: None Noted Violent Behavior Description: Pt denies Does patient have access to weapons?: Yes (Comment) Criminal Charges Pending?: Yes Describe Pending Criminal Charges: Speeding ticket Does patient have a court date: Yes Court Date: (Unknown) Is patient on probation?: No  Psychosis Hallucinations: None noted Delusions: Persecutory  Mental Status Report Appearance/Hygiene: Other (Comment)(Casually dressed) Eye Contact: Good Motor Activity: Restlessness Speech: Rapid Level of Consciousness: Alert Mood: Anxious, Labile Affect: Anxious, Labile Anxiety Level: Moderate Thought Processes: Coherent, Circumstantial Judgement: Impaired Orientation: Person, Place, Time, Situation Obsessive Compulsive Thoughts/Behaviors: None  Cognitive Functioning Concentration: Decreased Memory: Recent Intact, Remote Intact Is patient IDD: No Insight: Poor Impulse Control: Poor Appetite: Fair Have you had any weight changes? : No Change Sleep: No Change Total Hours of Sleep: 8 Vegetative Symptoms: None  ADLScreening [facility] Assessment Services) Patient's cognitive ability adequate to safely complete daily activities?: Yes Patient able to express need for assistance with ADLs?: Yes Independently performs ADLs?: Yes (appropriate for developmental age)  Prior Inpatient Therapy Prior Inpatient Therapy: No  Prior  Outpatient Therapy Prior Outpatient Therapy: Yes Prior Therapy Dates: Current Prior Therapy Facilty/Provider(s): Felix Pacini, DO Reason for Treatment: Medication management Does patient have an ACCT team?: No Does patient have Intensive In-House Services?  : No Does patient have [facility] services? : No Does patient have P4CC services?: No  ADL Screening (condition at time of admission) Patient's cognitive ability adequate to safely complete daily activities?: Yes Is the patient deaf or have difficulty hearing?: No Does the patient have difficulty seeing, even when wearing glasses/contacts?: No Does the patient have difficulty concentrating, remembering, or making decisions?: No Patient able to express need for assistance with ADLs?: Yes Does the patient have difficulty dressing or bathing?: No Independently performs ADLs?: Yes (appropriate for developmental age) Does the patient have difficulty walking or climbing stairs?: No Weakness of Legs: None Weakness of Arms/Hands: None  Home Assistive Devices/Equipment Home Assistive Devices/Equipment: None    Abuse/Neglect Assessment (Assessment to be complete while patient is alone) Abuse/Neglect Assessment Can Be Completed: Yes Physical Abuse: Denies Verbal Abuse: Denies Sexual Abuse: Denies Exploitation of patient/patient's resources: Denies Self-Neglect: Denies     Merchant navy officer (For Healthcare) Does Patient Have a Medical Advance Directive?: No Would patient like information on creating a medical advance directive?: No - Patient declined          Disposition: Gave clinical report to  Nira Conn, FNP who completed MSE and determined Pt meets criteria for inpatient psychiatric treatment. Pt will be admitted to the [facility]-hall when COVID test is resulted.  Disposition Initial Assessment Completed for this Encounter: Yes Disposition of Patient: Admit  On Site Evaluation by:  Nira Conn, FNP Reviewed with Physician:     Pamalee Leyden, [facility], [facility] Triage Specialist [phone]  Patsy Baltimore, Harlin Rain 08/16/2019 9:58 PM

## 2019-08-16 NOTE — H&P (Signed)
Behavioral Health Medical Screening Exam  Valerie Walters is an 39 y.o. female.  Total Time spent with patient: 20 minutes  Psychiatric Specialty Exam: Physical Exam  Constitutional: She is oriented to person, place, and time. She appears well-developed and well-nourished. No distress.  HENT:  Head: Normocephalic and atraumatic.  Right Ear: External ear normal.  Left Ear: External ear normal.  Eyes: Pupils are equal, round, and reactive to light. Right eye exhibits no discharge. Left eye exhibits no discharge.  Cardiovascular: Normal rate.  Respiratory: Effort normal. No respiratory distress.  Musculoskeletal:        General: Normal range of motion.  Neurological: She is alert and oriented to person, place, and time.  Skin: She is not diaphoretic.  Psychiatric: Her mood appears anxious. She is not withdrawn and not actively hallucinating. Thought content is paranoid. She expresses impulsivity and inappropriate judgment. She exhibits a depressed mood. She expresses suicidal ideation. She expresses no homicidal ideation. She expresses no suicidal plans.    Review of Systems  Constitutional: Negative for activity change, appetite change, chills, diaphoresis, fatigue, fever and unexpected weight change.  Respiratory: Negative for cough and shortness of breath.   Cardiovascular: Negative for chest pain.  Gastrointestinal: Negative for diarrhea, nausea and vomiting.  Neurological: Negative for dizziness and headaches.  Psychiatric/Behavioral: Positive for agitation, decreased concentration, dysphoric mood, sleep disturbance and suicidal ideas. The patient is nervous/anxious.   All other systems reviewed and are negative.   Blood pressure (!) 139/107, pulse 95, temperature 97.6 F (36.4 C), temperature source Oral, resp. rate 18, SpO2 98 %.There is no height or weight on file to calculate BMI.  General Appearance: Disheveled  Eye Contact:  Fair  Speech:  Pressured  Volume:  Increased   Mood:  Anxious, Depressed, Hopeless and Worthless  Affect:  Congruent  Thought Process:  Disorganized and Descriptions of Associations: Circumstantial  Orientation:  Full (Time, Place, and Person)  Thought Content:  Hallucinations: None and Paranoid Ideation  Suicidal Thoughts:  Yes.  without intent/plan  Homicidal Thoughts:  No  Memory:  Immediate;   Fair Recent;   Fair  Judgement:  Impaired  Insight:  Lacking  Psychomotor Activity:  Increased  Concentration: Concentration: Poor  Recall:  Fair  Fund of Knowledge:Good  Language: Good  Akathisia:  Negative  Handed:  Right  AIMS (if indicated):     Assets:  Communication Skills Desire for Improvement Housing Leisure Time Physical Health  Sleep:       Musculoskeletal: Strength & Muscle Tone: within normal limits Gait & Station: normal Patient leans: N/A  Blood pressure (!) 139/107, pulse 95, temperature 97.6 F (36.4 C), temperature source Oral, resp. rate 18, SpO2 98 %.  Recommendations:  Based on my evaluation the patient does not appear to have an emergency medical condition.  Jackelyn Poling, NP 08/16/2019, 10:53 PM

## 2019-08-17 ENCOUNTER — Other Ambulatory Visit: Payer: Self-pay

## 2019-08-17 DIAGNOSIS — Z833 Family history of diabetes mellitus: Secondary | ICD-10-CM | POA: Diagnosis not present

## 2019-08-17 DIAGNOSIS — Z20822 Contact with and (suspected) exposure to covid-19: Secondary | ICD-10-CM | POA: Diagnosis present

## 2019-08-17 DIAGNOSIS — F15150 Other stimulant abuse with stimulant-induced psychotic disorder with delusions: Secondary | ICD-10-CM | POA: Diagnosis present

## 2019-08-17 DIAGNOSIS — R45851 Suicidal ideations: Secondary | ICD-10-CM | POA: Diagnosis present

## 2019-08-17 DIAGNOSIS — Z8249 Family history of ischemic heart disease and other diseases of the circulatory system: Secondary | ICD-10-CM | POA: Diagnosis not present

## 2019-08-17 DIAGNOSIS — F1594 Other stimulant use, unspecified with stimulant-induced mood disorder: Secondary | ICD-10-CM

## 2019-08-17 DIAGNOSIS — Z8349 Family history of other endocrine, nutritional and metabolic diseases: Secondary | ICD-10-CM | POA: Diagnosis not present

## 2019-08-17 DIAGNOSIS — F1514 Other stimulant abuse with stimulant-induced mood disorder: Secondary | ICD-10-CM | POA: Diagnosis present

## 2019-08-17 LAB — LIPID PANEL
Cholesterol: 217 mg/dL — ABNORMAL HIGH (ref 0–200)
HDL: 60 mg/dL (ref >40)
LDL Cholesterol: 138 mg/dL — ABNORMAL HIGH (ref 0–99)
Total CHOL/HDL Ratio: 3.6 RATIO
Triglycerides: 96 mg/dL (ref <150)
VLDL: 19 mg/dL (ref 0–40)

## 2019-08-17 LAB — COMPREHENSIVE METABOLIC PANEL
ALT: 21 U/L (ref 0–44)
AST: 21 U/L (ref 15–41)
Albumin: 3.9 g/dL (ref 3.5–5.0)
Alkaline Phosphatase: 56 U/L (ref 38–126)
Anion gap: 9 (ref 5–15)
BUN: 19 mg/dL (ref 6–20)
CO2: 25 mmol/L (ref 22–32)
Calcium: 9.2 mg/dL (ref 8.9–10.3)
Chloride: 102 mmol/L (ref 98–111)
Creatinine, Ser: 0.73 mg/dL (ref 0.44–1.00)
GFR calc Af Amer: >60 mL/min (ref >60)
GFR calc non Af Amer: >60 mL/min (ref >60)
Glucose, Bld: 112 mg/dL — ABNORMAL HIGH (ref 70–99)
Potassium: 3.7 mmol/L (ref 3.5–5.1)
Sodium: 136 mmol/L (ref 135–145)
Total Bilirubin: 0.8 mg/dL (ref 0.3–1.2)
Total Protein: 7.1 g/dL (ref 6.5–8.1)

## 2019-08-17 LAB — CBC
HCT: 37.4 % (ref 36.0–46.0)
Hemoglobin: 11.6 g/dL — ABNORMAL LOW (ref 12.0–15.0)
MCH: 27.4 pg (ref 26.0–34.0)
MCHC: 31 g/dL (ref 30.0–36.0)
MCV: 88.4 fL (ref 80.0–100.0)
Platelets: 474 10*3/uL — ABNORMAL HIGH (ref 150–400)
RBC: 4.23 MIL/uL (ref 3.87–5.11)
RDW: 16 % — ABNORMAL HIGH (ref 11.5–15.5)
WBC: 10.4 10*3/uL (ref 4.0–10.5)
nRBC: 0 % (ref 0.0–0.2)

## 2019-08-17 LAB — RESPIRATORY PANEL BY RT PCR (FLU A&B, COVID)
Influenza A by PCR: NEGATIVE
Influenza B by PCR: NEGATIVE
SARS Coronavirus 2 by RT PCR: NEGATIVE

## 2019-08-17 LAB — HEMOGLOBIN A1C
Hgb A1c MFr Bld: 5.7 % — ABNORMAL HIGH (ref 4.8–5.6)
Mean Plasma Glucose: 116.89 mg/dL

## 2019-08-17 LAB — TSH: TSH: 2.19 u[IU]/mL (ref 0.350–4.500)

## 2019-08-17 MED ORDER — ARIPIPRAZOLE 5 MG PO TABS
5.0000 mg | ORAL_TABLET | Freq: Every day | ORAL | Status: DC
  Administered 2019-08-18: 5 mg via ORAL
  Filled 2019-08-17 (×3): qty 1

## 2019-08-17 MED ORDER — LORAZEPAM 1 MG PO TABS
1.0000 mg | ORAL_TABLET | Freq: Four times a day (QID) | ORAL | Status: DC | PRN
  Administered 2019-08-17 – 2019-08-19 (×5): 1 mg via ORAL
  Filled 2019-08-17 (×5): qty 1

## 2019-08-17 MED ORDER — LORAZEPAM 0.5 MG PO TABS: 0.5000 mg | ORAL_TABLET | Freq: Four times a day (QID) | ORAL | Status: DC | PRN

## 2019-08-17 MED ORDER — GABAPENTIN 100 MG PO CAPS
100.0000 mg | ORAL_CAPSULE | Freq: Three times a day (TID) | ORAL | Status: DC
  Administered 2019-08-17 – 2019-08-19 (×6): 100 mg via ORAL
  Filled 2019-08-17: qty 21
  Filled 2019-08-17 (×5): qty 1
  Filled 2019-08-17: qty 21
  Filled 2019-08-17 (×5): qty 1
  Filled 2019-08-17: qty 21

## 2019-08-17 MED ORDER — LORAZEPAM 2 MG/ML IJ SOLN: 1.0000 mg | Freq: Four times a day (QID) | INTRAMUSCULAR | Status: DC | PRN

## 2019-08-17 NOTE — Plan of Care (Signed)
[facility] Observation Crisis Plan  Reason for Crisis Plan:  Crisis Stabilization   Plan of Care:  Referral for Inpatient Hospitalization  Family Support:      Current Living Environment:  Living Arrangements: Spouse/significant other, Children  Insurance:   Hospital Account    Name Acct ID Class Status Primary Coverage   Valerie, Walters [id number] BEHAVIORAL HEALTH OBSERVATION Open [employer] 3-WAY - 3-WAY [employer]        Guarantor Account (for Hospital Account [id number])    Name Relation to Pt Service Area Active? Acct Type   Valerie Walters Self [facility] Yes Behavioral Health   Address Phone       [address] [place], [place] [address] [phone](H)          Coverage Information (for Hospital Account [id number])    F/O Payor/Plan Precert #   [employer] 3-WAY/3-WAY [employer]    Subscriber Subscriber #   Valerie, Walters [id number]   Address Phone   [address] [place], [place] [address] [phone]      Legal Guardian:  Legal Guardian: (Self)  Primary Care Provider:  Natalia Leatherwood, DO  Current Outpatient Providers:  Valerie Johns, MD  Psychiatrist:  Name of Psychiatrist: none  Counselor/Therapist:  Name of Therapist: None  Compliant with Medications:  Yes  Additional Information:   Tasia Catchings 2/7/202112:26 AM

## 2019-08-17 NOTE — Tx Team (Signed)
Initial Treatment Plan 08/17/2019 1:19 PM [name] [id number]    PATIENT STRESSORS: Financial difficulties Medication change or noncompliance Substance abuse Traumatic event   PATIENT STRENGTHS: Capable of independent living Communication skills Physical Health Supportive family/friends   PATIENT IDENTIFIED PROBLEMS: depression  anxiety  Suicidal ideations  Marital issues  Substance use/abuse             DISCHARGE CRITERIA:  Ability to meet basic life and health needs Adequate post-discharge living arrangements Improved stabilization in mood, thinking, and/or behavior Motivation to continue treatment in a less acute level of care  PRELIMINARY DISCHARGE PLAN: Attend 12-step recovery group Outpatient therapy Participate in family therapy Return to previous living arrangement  PATIENT/FAMILY INVOLVEMENT: This treatment plan has been presented to and reviewed with the patient, Valerie Walters.  The patient and family have been given the opportunity to ask questions and make suggestions.  Raylene Miyamoto, RN 08/17/2019, 1:19 PM

## 2019-08-17 NOTE — Progress Notes (Signed)
   08/17/19 2217  COVID-19 Daily Checkoff  Have you had a fever (temp > 37.80C/100F)  in the past 24 hours?  No  If you have had runny nose, nasal congestion, sneezing in the past 24 hours, has it worsened? No  COVID-19 EXPOSURE  Have you traveled outside the state in the past 14 days? No  Have you been in contact with someone with a confirmed diagnosis of COVID-19 or PUI in the past 14 days without wearing appropriate PPE? No  Have you been living in the same home as a person with confirmed diagnosis of COVID-19 or a PUI (household contact)? No  Have you been diagnosed with COVID-19? No
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
## 2019-08-17 NOTE — Progress Notes (Signed)
   08/17/19 2225  Psych Admission Type (Psych Patients Only)  Admission Status Involuntary  Psychosocial Assessment  Patient Complaints Anxiety  Eye Contact Fair  Facial Expression Anxious  Affect Appropriate to circumstance  Speech Logical/coherent  Interaction Assertive  Motor Activity Other (Comment) (WDL)  Appearance/Hygiene Unremarkable  Behavior Characteristics Appropriate to situation  Mood Anxious  Thought Process  Coherency WDL  Content WDL  Delusions None reported or observed  Perception WDL  Hallucination None reported or observed  Judgment Poor  Confusion None  Danger to Self  Current suicidal ideation? Denies  Self-Injurious Behavior No self-injurious ideation or behavior indicators observed or expressed   Agreement Not to Harm Self Yes  Description of Agreement verbal  Danger to Others  Danger to Others None reported or observed

## 2019-08-18 LAB — RAPID URINE DRUG SCREEN, HOSP PERFORMED
Amphetamines: POSITIVE — AB
Barbiturates: NOT DETECTED
Benzodiazepines: POSITIVE — AB
Cocaine: NOT DETECTED
Opiates: NOT DETECTED
Tetrahydrocannabinol: NOT DETECTED

## 2019-08-18 LAB — PREGNANCY, URINE: Preg Test, Ur: NEGATIVE

## 2019-08-18 MED ORDER — ARIPIPRAZOLE 10 MG PO TABS
10.0000 mg | ORAL_TABLET | Freq: Every day | ORAL | Status: DC
  Administered 2019-08-19: 10 mg via ORAL
  Filled 2019-08-18 (×2): qty 1
  Filled 2019-08-18: qty 7

## 2019-08-18 NOTE — [facility] Group Notes (Signed)
Pt did not attend wrap up group this evening. Pt was in bed sleeping.  

## 2019-08-18 NOTE — Progress Notes (Signed)
Spiritual care group on grief and loss facilitated by chaplain Smokey Melott  Group Goal:  Support / Education around grief and loss Members engage in facilitated group support and psycho-social education.  Group Description:  Following introductions and group rules, group members engaged in facilitated group dialog and support around topic of loss, with particular support around experiences of loss in their lives. Group Identified types of loss (relationships / self / things) and identified patterns, circumstances, and changes that precipitate losses. Reflected on thoughts / feelings around loss, normalized grief responses, and recognized variety in grief experience. Patient Progress: Did not attend  

## 2019-08-18 NOTE — [facility] Suicide Risk Assessment (Signed)
[facility] INPATIENT:  Family/Significant Other Suicide Prevention Education  Suicide Prevention Education:  Patient Refusal for Family/Significant Other Suicide Prevention Education: The patient Valerie Walters has refused to provide written consent for family/significant other to be provided Family/Significant Other Suicide Prevention Education during admission and/or prior to discharge.  Physician notified.  Darreld Mclean 08/18/2019, 2:58 PM

## 2019-08-18 NOTE — Progress Notes (Signed)
   08/18/19 2200  Psych Admission Type (Psych Patients Only)  Admission Status Involuntary  Psychosocial Assessment  Patient Complaints Anxiety  Eye Contact Fair  Facial Expression Anxious  Affect Appropriate to circumstance  Speech Logical/coherent  Interaction Assertive  Motor Activity Other (Comment) (WDL)  Appearance/Hygiene Unremarkable  Behavior Characteristics Appropriate to situation  Mood Anxious  Thought Process  Coherency WDL  Content WDL  Delusions None reported or observed  Perception WDL  Hallucination None reported or observed  Judgment Poor  Confusion None  Danger to Self  Current suicidal ideation? Denies  Self-Injurious Behavior No self-injurious ideation or behavior indicators observed or expressed   Agreement Not to Harm Self Yes  Description of Agreement verbal  Danger to Others  Danger to Others None reported or observed

## 2019-08-18 NOTE — Progress Notes (Signed)
Recreation Therapy Notes  Date:  2.8.21 Time: 0930 Location: [address] Dayroom  Group Topic: Stress Management  Goal Area(s) Addresses:  Patient will identify positive stress management techniques. Patient will identify benefits of using stress management post d/c.  Intervention: Stress Management  Activity :  Meditation.  LRT played Walters meditation that focused on making the most of your day and making every moment count.  Patients were to listen and follow along as meditation played to engage in activity.  Education:  Stress Management, Discharge Planning.   Education Outcome: Acknowledges Education  Clinical Observations/Feedback:  Pt did not attend group activity.    Valerie Walters, LRT/CTRS         Valerie Walters 08/18/2019 10:50 AM 

## 2019-08-18 NOTE — [facility] Group Notes (Signed)
LCSW Group Therapy Notes  Type of Therapy and Topic: Group Therapy: Healthy Vs. Unhealthy Coping Strategies  Participation Level: [facility] PARTICIPATION LEVEL: Active  Description of Group: In this group, patients will be encouraged to explore their healthy and unhealthy coping strategics. Coping strategies are actions that we take to deal with stress, problems, or uncomfortable emotions in our daily lives. Each patient will be challenged to read some scenarios and discuss the unhealthy and healthy coping strategies within those scenarios. Also, each patient will be challenged to describe current healthy and unhealthy strategies that they use in their own lives and discuss the outcomes and barriers to those strategies. This group will be process-oriented, with patients participating in exploration of their own experiences as well as giving and receiving support and challenge from other group members.  Therapeutic Goals: Patient will identify personal healthy and unhealthy coping strategies. Patient will identify healthy and unhealthy coping strategies, in others, through scenarios. Patient will identify expected outcomes of healthy and unhealthy coping strategies. Patient will identify barriers to using healthy coping strategies.  Summary of Patient Progress:  Due to the COVID-19 pandemic and acuity of the unit, this group has been supplemented with worksheets.  Therapeutic Modalities:  Cognitive Behavioral Therapy Solution Focused Therapy Motivational Interviewing 

## 2019-08-18 NOTE — Tx Team (Signed)
Interdisciplinary Treatment and Diagnostic Plan Update  08/18/2019 Time of Session: 9:00a Valerie Walters MRN: [id number]  Principal Diagnosis: <principal problem not specified>  Secondary Diagnoses: Active Problems:   Stimulant-induced mood disorder ([facility])   Current Medications:  Current Facility-Administered Medications  Medication Dose Route Frequency Provider Last Rate Last Admin  . acetaminophen (TYLENOL) tablet 650 mg  650 mg Oral Q6H PRN Lindon Romp A, NP   650 mg at 08/17/19 1632  . alum & mag hydroxide-simeth (MAALOX/MYLANTA) 200-200-20 MG/5ML suspension 30 mL  30 mL Oral Q4H PRN Lindon Romp A, NP      . ARIPiprazole (ABILIFY) tablet 5 mg  5 mg Oral Daily Cobos, Myer Peer, MD   5 mg at 08/18/19 0820  . gabapentin (NEURONTIN) capsule 100 mg  100 mg Oral TID Cobos, Myer Peer, MD   100 mg at 08/18/19 0819  . LORazepam (ATIVAN) tablet 1 mg  1 mg Oral Q6H PRN Cobos, Myer Peer, MD   1 mg at 08/18/19 8466   Or  . LORazepam (ATIVAN) injection 1 mg  1 mg Intramuscular Q6H PRN Cobos, Myer Peer, MD      . pantoprazole (PROTONIX) EC tablet 40 mg  40 mg Oral Daily Lindon Romp A, NP   40 mg at 08/18/19 5993   PTA Medications: Medications Prior to Admission  Medication Sig Dispense Refill Last Dose  . acetaminophen (TYLENOL) 325 MG tablet Take 650 mg by mouth daily.   08/16/2019 at Unknown time  . gabapentin (NEURONTIN) 300 MG capsule Take 1 capsule (300 mg total) by mouth at bedtime. (Patient taking differently: Take 300 mg by mouth at bedtime as needed (nerve pain). ) 90 capsule 1 Past Month at Unknown time  . hydrOXYzine (VISTARIL) 100 MG capsule Take 1 capsule (100 mg total) by mouth 3 (three) times daily as needed for itching. 90 capsule 1 08/16/2019 at Unknown time  . ibuprofen (ADVIL) 200 MG tablet Take 400 mg by mouth at bedtime.   08/16/2019 at Unknown time  . OLANZapine (ZYPREXA) 5 MG tablet Take 1 tablet (5 mg total) by mouth at bedtime as needed. 90 tablet 1 08/16/2019 at Unknown time   . omeprazole (PRILOSEC) 40 MG capsule Take 1 capsule (40 mg total) by mouth daily. (Patient taking differently: Take 40 mg by mouth daily as needed (stomach discomfort). ) 30 capsule 2 Past Month at Unknown time  . venlafaxine XR (EFFEXOR XR) 75 MG 24 hr capsule Take 1 capsule (75 mg total) by mouth daily with breakfast. 90 capsule 0 08/16/2019 at Unknown time    Patient Stressors: Financial difficulties Medication change or noncompliance Substance abuse Traumatic event  Patient Strengths: Capable of independent living Armed forces logistics/support/administrative officer Physical Health Supportive family/friends  Treatment Modalities: Medication Management, Group therapy, Case management,  1 to 1 session with clinician, Psychoeducation, Recreational therapy.   Physician Treatment Plan for Primary Diagnosis: <principal problem not specified> Long Term Goal(s): Improvement in symptoms so as ready for discharge Improvement in symptoms so as ready for discharge   Short Term Goals: Ability to identify changes in lifestyle to reduce recurrence of condition will improve Ability to verbalize feelings will improve Ability to disclose and discuss suicidal ideas Ability to demonstrate self-control will improve Ability to identify and develop effective coping behaviors will improve  Medication Management: Evaluate patient's response, side effects, and tolerance of medication regimen.  Therapeutic Interventions: 1 to 1 sessions, Unit Group sessions and Medication administration.  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  Therapeutic Interventions: 1 to 1 sessions, Unit Group sessions and Medication administration.  Evaluation of Outcomes: Not Met   RN Treatment Plan for Primary Diagnosis: <principal problem not specified> Long Term Goal(s): Knowledge of disease and therapeutic regimen to maintain health will improve  Short Term Goals: Ability to participate in decision making will improve, Ability to verbalize feelings will improve, Ability to identify and develop effective coping behaviors will improve and Compliance with prescribed medications will improve  Medication Management: RN will administer medications as ordered by provider, will assess and evaluate patient's response and provide education to patient for prescribed medication. RN will report any adverse and/or side effects to prescribing provider.  Therapeutic Interventions: 1 on 1 counseling sessions, Psychoeducation, Medication administration, Evaluate responses to treatment, Monitor vital signs and CBGs as ordered, Perform/monitor CIWA, COWS, AIMS and Fall Risk screenings as ordered, Perform wound care treatments as ordered.  Evaluation of Outcomes: Not Met   LCSW Treatment Plan for Primary Diagnosis: <principal problem not specified> Long Term Goal(s): Safe transition to appropriate next level of care at discharge, Engage patient in therapeutic group addressing interpersonal concerns.  Short Term Goals: Engage patient in aftercare planning with referrals and resources  Therapeutic Interventions: Assess for all discharge needs, 1 to 1 time with Social worker, Explore available resources and support systems, Assess for adequacy in community support network, Educate family and significant other(s) on  suicide prevention, Complete Psychosocial Assessment, Interpersonal group therapy.  Evaluation of Outcomes: Not Met   Progress in Treatment: Attending groups: No. Participating in groups: No. Taking medication as prescribed: Yes. Toleration medication: Yes. Family/Significant other contact made: No, will contact:  if patient consents to collateral contacts Patient understands diagnosis: Yes. Discussing patient identified problems/goals with staff: Yes. Medical problems stabilized or resolved: Yes. Denies suicidal/homicidal ideation: Yes. Issues/concerns per patient self-inventory: No. Other:    New problem(s) identified: None   New Short Term/Long Term Goal(s): Detox, medication stabilization, elimination of SI thoughts, development of comprehensive mental wellness plan.    Patient Goals: " To go home"   Discharge Plan or Barriers: Patient recently admitted. CSW will continue to follow and assess for appropriate referrals and possible discharge planning.    Reason for Continuation of Hospitalization: Anxiety Depression Medication stabilization Suicidal ideation  Estimated Length of Stay: 3-5 days   Attendees: Patient: Valerie Walters  08/18/2019 10:15 AM  Physician: Dr. Neita Garnet, MD 08/18/2019 10:15 AM  Nursing:  08/18/2019 10:15 AM  RN Care Manager: 08/18/2019 10:15 AM  Social Worker: Radonna Ricker, LCSW 08/18/2019 10:15 AM  Recreational Therapist:  08/18/2019 10:15 AM  Other:  08/18/2019 10:15 AM  Other:  08/18/2019 10:15 AM  Other: 08/18/2019 10:15 AM    Scribe for Treatment Team: Marylee Floras, Economy 08/18/2019 10:15 AM
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
## 2019-08-18 NOTE — Progress Notes (Signed)
Patient denied SI and HI, contracts for safety.  Denied A/V hallucinations.  Denied pain. Medications administered per MD orders.  Emotional support and encouragement given patient. Safety maintained with 15 minute checks.   

## 2019-08-19 DIAGNOSIS — F1514 Other stimulant abuse with stimulant-induced mood disorder: Principal | ICD-10-CM

## 2019-08-19 MED ORDER — GABAPENTIN 100 MG PO CAPS: 100.0000 mg | ORAL_CAPSULE | Freq: Three times a day (TID) | ORAL | 0 refills | Status: DC

## 2019-08-19 MED ORDER — ARIPIPRAZOLE 10 MG PO TABS: 10.0000 mg | ORAL_TABLET | Freq: Every day | ORAL | 0 refills | Status: DC

## 2019-08-19 NOTE — Progress Notes (Signed)
Patient denied SI and HI, contracts for safety.  Denied A/V hallucinations.  Safety maintained with 15 minute checks. Medications administered per MD orders.  Emotional support and encouragement given patient.

## 2019-08-19 NOTE — Progress Notes (Signed)
  [facility] Adult Case Management Discharge Plan :  Will you be returning to the same living situation after discharge:  Yes,  patient is returning home with her spouse and child At discharge, do you have transportation home?: Yes,  patient's spouse is picking her up Do you have the ability to pay for your medications: No.  Release of information consent forms completed and in the chart;  Patient's signature needed at discharge.  Patient to Follow up at: Follow-up Information    [facility] Follow up on 08/22/2019.   Why: You are scheduled for an appointment on 08/22/19 at 8:30 am.  This will be a virtual tele-health appointment.  Please have your insurance information and your discharge summary available. Contact information: [address] [place] [address] [phone]           Next level of care provider has access to [facility] Link:yes  Safety Planning and Suicide Prevention discussed: Yes,  with the patient      Has patient been referred to the Quitline?: Patient refused referral  Patient has been referred for addiction treatment: Pt. refused referral  Maeola Sarah, LCSWA 08/19/2019, 10:15 AM

## 2019-08-19 NOTE — [facility]HH Suicide Risk Assessment (Signed)
[facility] Discharge Suicide Risk Assessment   Principal Problem: <principal problem not specified> Discharge Diagnoses: Active Problems:   Stimulant-induced mood disorder (HCC)   Total Time spent with patient: 15 minutes  Musculoskeletal: Strength & Muscle Tone: within normal limits Gait & Station: normal Patient leans: N/A  Psychiatric Specialty Exam: Review of Systems  All other systems reviewed and are negative.   Blood pressure 114/77, pulse 89, temperature 97.7 F (36.5 C), temperature source Oral, resp. rate 16, height 5\' 3"  (1.6 m), weight 78.9 kg, SpO2 98 %.Body mass index is 30.82 kg/m.  General Appearance: Casual  Eye Contact::  Fair  Speech:  Normal Rate409  Volume:  Normal  Mood:  Euthymic  Affect:  Congruent  Thought Process:  Coherent and Descriptions of Associations: Intact  Orientation:  Full (Time, Place, and Person)  Thought Content:  Logical  Suicidal Thoughts:  No  Homicidal Thoughts:  No  Memory:  Immediate;   Fair Recent;   Fair Remote;   Fair  Judgement:  Intact  Insight:  Fair  Psychomotor Activity:  Normal  Concentration:  Good  Recall:  Good  Fund of Knowledge:Good  Language: Good  Akathisia:  Negative  Handed:  Right  AIMS (if indicated):     Assets:  Desire for Improvement Housing Resilience  Sleep:  Number of Hours: 6.5  Cognition: WNL  ADL's:  Intact   Mental Status Per Nursing Assessment::   On Admission:  Self-harm behaviors, Suicidal ideation indicated by others  Demographic Factors:  Caucasian and Low socioeconomic status  Loss Factors: NA  Historical Factors: Impulsivity  Risk Reduction Factors:   Living with another person, especially a relative and Positive social support  Continued Clinical Symptoms:  Depression:   Comorbid alcohol abuse/dependence Impulsivity Alcohol/Substance Abuse/Dependencies  Cognitive Features That Contribute To Risk:  None    Suicide Risk:  Minimal: No identifiable suicidal ideation.   Patients presenting with no risk factors but with morbid ruminations; may be classified as minimal risk based on the severity of the depressive symptoms  Follow-up Information    [facility] Follow up on 08/22/2019.   Why: You are scheduled for an appointment on 08/22/19 at 8:30 am.  This will be a virtual tele-health appointment.  Please have your insurance information and your discharge summary available. Contact information: [address] [place] [phone]           Plan Of Care/Follow-up recommendations:  Activity:  ad lib  [phone], MD 08/19/2019, 8:53 AM
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
## 2019-08-27 ENCOUNTER — Telehealth: Payer: Self-pay

## 2019-08-27 NOTE — Telephone Encounter (Signed)
Reviewed D/C notes with Dr Claiborne Billings and pt was seen for Psych reasons. Appt was made with [facility] health. Pt needs to follow up with them if they are RX medications for patient. Pt does not need any F/U labs, tests, etc with PCP. Pt can make appt if she would like to discuss something with Dr Claiborne Billings.    Pt was called and VM could not be left due to mailbox being full. Due to weather pt can be put on schedule this afternoon if she returns call.

## 2019-08-27 NOTE — Telephone Encounter (Signed)
Pt was called again, went to VM, unable to leave VM due to it being full

## 2019-08-28 ENCOUNTER — Inpatient Hospital Stay: Payer: Self-pay | Admitting: Family Medicine

## 2019-09-01 ENCOUNTER — Other Ambulatory Visit: Payer: Self-pay

## 2019-09-02 ENCOUNTER — Ambulatory Visit: Payer: Self-pay | Admitting: Family Medicine

## 2019-09-02 DIAGNOSIS — Z0289 Encounter for other administrative examinations: Secondary | ICD-10-CM

## 2019-09-05 ENCOUNTER — Encounter: Payer: Self-pay | Admitting: Family Medicine

## 2019-09-05 ENCOUNTER — Ambulatory Visit (INDEPENDENT_AMBULATORY_CARE_PROVIDER_SITE_OTHER): Payer: Self-pay | Admitting: Family Medicine

## 2019-09-05 ENCOUNTER — Other Ambulatory Visit: Payer: Self-pay

## 2019-09-05 VITALS — BP 138/90 | HR 84 | Temp 98.5°F | Resp 17 | Ht 64.0 in | Wt 170.1 lb

## 2019-09-05 DIAGNOSIS — F1594 Other stimulant use, unspecified with stimulant-induced mood disorder: Secondary | ICD-10-CM

## 2019-09-05 DIAGNOSIS — F309 Manic episode, unspecified: Secondary | ICD-10-CM

## 2019-09-05 DIAGNOSIS — F22 Delusional disorders: Secondary | ICD-10-CM

## 2019-09-05 DIAGNOSIS — F418 Other specified anxiety disorders: Secondary | ICD-10-CM

## 2019-09-05 MED ORDER — GABAPENTIN 100 MG PO CAPS: 100.0000 mg | ORAL_CAPSULE | Freq: Three times a day (TID) | ORAL | 5 refills | Status: DC

## 2019-09-05 MED ORDER — ARIPIPRAZOLE 10 MG PO TABS: 10.0000 mg | ORAL_TABLET | Freq: Every day | ORAL | 1 refills | Status: DC
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
## 2019-09-05 NOTE — Patient Instructions (Signed)
I have refilled your medications.  I can draft you a letter with your diagnosis. You can pick it up or we can mail it to you Monday.   Please call [facility] and get your appt rescheduled.

## 2019-09-08 ENCOUNTER — Encounter: Payer: Self-pay | Admitting: Family Medicine

## 2020-02-25 ENCOUNTER — Ambulatory Visit: Payer: Self-pay | Admitting: Family Medicine

## 2020-03-03 ENCOUNTER — Other Ambulatory Visit: Payer: Self-pay

## 2020-03-03 ENCOUNTER — Ambulatory Visit: Payer: Self-pay | Admitting: Family Medicine

## 2020-03-03 VITALS — BP 98/58 | HR 88 | Temp 98.3°F | Resp 12 | Ht 64.02 in | Wt 169.4 lb

## 2020-03-03 DIAGNOSIS — F418 Other specified anxiety disorders: Secondary | ICD-10-CM

## 2020-03-03 DIAGNOSIS — F22 Delusional disorders: Secondary | ICD-10-CM

## 2020-03-03 DIAGNOSIS — F309 Manic episode, unspecified: Secondary | ICD-10-CM

## 2020-03-03 MED ORDER — OLANZAPINE 5 MG PO TABS: 5.0000 mg | ORAL_TABLET | Freq: Every day | ORAL | 1 refills | Status: DC

## 2020-03-03 MED ORDER — PROPRANOLOL HCL 20 MG PO TABS: 20.0000 mg | ORAL_TABLET | Freq: Two times a day (BID) | ORAL | 2 refills | Status: DC | PRN

## 2020-03-03 MED ORDER — VENLAFAXINE HCL ER 75 MG PO CP24: 75.0000 mg | ORAL_CAPSULE | Freq: Every day | ORAL | 0 refills | Status: DC

## 2020-03-03 NOTE — Patient Instructions (Signed)
Restart effexor 75 mg a day and Zyprexa 5 mg before nightly.   Propanolol you can take up to every 12 hours IF NEEDED only for anxiousness or panic.   Avoid caffeine or any other stimulants or alcohol.    Generalized Anxiety Disorder, Adult Generalized anxiety disorder (GAD) is a mental health disorder. People with this condition constantly worry about everyday events. Unlike normal anxiety, worry related to GAD is not triggered by a specific event. These worries also do not fade or get better with time. GAD interferes with life functions, including relationships, work, and school. GAD can vary from mild to severe. People with severe GAD can have intense waves of anxiety with physical symptoms (panic attacks). What are the causes? The exact cause of GAD is not known. What increases the risk? This condition is more likely to develop in:  Women.  People who have a family history of anxiety disorders.  People who are very shy.  People who experience very stressful life events, such as the death of a loved one.  People who have a very stressful family environment. What are the signs or symptoms? People with GAD often worry excessively about many things in their lives, such as their health and family. They may also be overly concerned about:  Doing well at work.  Being on time.  Natural disasters.  Friendships. Physical symptoms of GAD include:  Fatigue.  Muscle tension or having muscle twitches.  Trembling or feeling shaky.  Being easily startled.  Feeling like your heart is pounding or racing.  Feeling out of breath or like you cannot take a deep breath.  Having trouble falling asleep or staying asleep.  Sweating.  Nausea, diarrhea, or irritable bowel syndrome (IBS).  Headaches.  Trouble concentrating or remembering facts.  Restlessness.  Irritability. How is this diagnosed? Your health care provider can diagnose GAD based on your symptoms and medical  history. You will also have a physical exam. The health care provider will ask specific questions about your symptoms, including how severe they are, when they started, and if they come and go. Your health care provider may ask you about your use of alcohol or drugs, including prescription medicines. Your health care provider may refer you to a mental health specialist for further evaluation. Your health care provider will do a thorough examination and may perform additional tests to rule out other possible causes of your symptoms. To be diagnosed with GAD, a person must have anxiety that:  Is out of his or her control.  Affects several different aspects of his or her life, such as work and relationships.  Causes distress that makes him or her unable to take part in normal activities.  Includes at least three physical symptoms of GAD, such as restlessness, fatigue, trouble concentrating, irritability, muscle tension, or sleep problems. Before your health care provider can confirm a diagnosis of GAD, these symptoms must be present more days than they are not, and they must last for six months or longer. How is this treated? The following therapies are usually used to treat GAD:  Medicine. Antidepressant medicine is usually prescribed for long-term daily control. Antianxiety medicines may be added in severe cases, especially when panic attacks occur.  Talk therapy (psychotherapy). Certain types of talk therapy can be helpful in treating GAD by providing support, education, and guidance. Options include: ? Cognitive behavioral therapy (CBT). People learn coping skills and techniques to ease their anxiety. They learn to identify unrealistic or negative thoughts and  behaviors and to replace them with positive ones. ? Acceptance and commitment therapy (ACT). This treatment teaches people how to be mindful as a way to cope with unwanted thoughts and feelings. ? Biofeedback. This process trains you to  manage your body's response (physiological response) through breathing techniques and relaxation methods. You will work with a therapist while machines are used to monitor your physical symptoms.  Stress management techniques. These include yoga, meditation, and exercise. A mental health specialist can help determine which treatment is best for you. Some people see improvement with one type of therapy. However, other people require a combination of therapies. Follow these instructions at home:  Take over-the-counter and prescription medicines only as told by your health care provider.  Try to maintain a normal routine.  Try to anticipate stressful situations and allow extra time to manage them.  Practice any stress management or self-calming techniques as taught by your health care provider.  Do not punish yourself for setbacks or for not making progress.  Try to recognize your accomplishments, even if they are small.  Keep all follow-up visits as told by your health care provider. This is important. Contact a health care provider if:  Your symptoms do not get better.  Your symptoms get worse.  You have signs of depression, such as: ? A persistently sad, cranky, or irritable mood. ? Loss of enjoyment in activities that used to bring you joy. ? Change in weight or eating. ? Changes in sleeping habits. ? Avoiding friends or family members. ? Loss of energy for normal tasks. ? Feelings of guilt or worthlessness. Get help right away if:  You have serious thoughts about hurting yourself or others. If you ever feel like you may hurt yourself or others, or have thoughts about taking your own life, get help right away. You can go to your nearest emergency department or call:  Your local emergency services (911 in the [place]  A suicide crisis helpline, such as the National Suicide Prevention Lifeline at 6801583034. This is open 24 hours a day. Summary  Generalized anxiety  disorder (GAD) is a mental health disorder that involves worry that is not triggered by a specific event.  People with GAD often worry excessively about many things in their lives, such as their health and family.  GAD may cause physical symptoms such as restlessness, trouble concentrating, sleep problems, frequent sweating, nausea, diarrhea, headaches, and trembling or muscle twitching.  A mental health specialist can help determine which treatment is best for you. Some people see improvement with one type of therapy. However, other people require a combination of therapies. This information is not intended to replace advice given to you by your health care provider. Make sure you discuss any questions you have with your health care provider. Document Revised: 06/08/2017 Document Reviewed: 05/16/2016 Elsevier Patient Education  2020 [employer].

## 2020-03-03 NOTE — Progress Notes (Signed)
This visit occurred during the SARS-CoV-2 public health emergency.  Safety protocols were in place, including screening questions prior to the visit, additional usage of staff PPE, and extensive cleaning of exam room while observing appropriate contact time as indicated for disinfecting solutions.      SUBJECTIVE Chief Complaint  Patient presents with  . Follow-up    Pt would like new prescriptions for Effexor and zyprexa     HPI: Valerie Walters is a 39 y.o. female present for [facility] follow on depression and anxiety. She has experienced paranoid features and delusions.  Patient would like to be placed back on Effexor and Zyprexa.  These medications were removed after she had a complaint of palpitations and possible QT prolongation during ED visit-she had been on Vistaril as well.  Of note, she was also on nonprescribed stimulants at that time as well.  Patient's regimen had been switched over to Abilify, gabapentin and Vistaril secondary to ED findings.  She states she was not well controlled on that medication and did not feel it was helpful.  She has not taken Abilify in quite some time.  She had some old Zyprexa left over and had been using that on occasions nightly. She reports she still having some paranoid thoughts, which she feels is not as bad as it has been in the past.  She is still with her husband. Prior note:  F/U  after recent behavioral health admission.  Patient has a known history of mania, depression with anxiety with paranoid features.  She reports she was admitted to the behavioral health unit after having an event in which she and her husband were arguing.  She states that she feels he is "messing with her mind.  "She believes he has switched out all of her gold chains were cheaper versions.  She believes she hears people on the roof during the middle the night.  She believes there is females in the house and her husband is having sexual relations behind her back.  She states she  hears people leaving the house as she is coming in to the home.  She feels they are waiting on my woods. She did not follow-up with psychiatry when referred.  She has not scheduled her an appointment with [facility] for her discharge instructions from behavioral health.  They have switched her migraine secondary to a find of QT prolongation therefore they discontinued her Effexor and her Zyprexa.  In place they have started her on Abilify and gabapentin.  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  ROS: See pertinent positives and negatives per HPI.  Patient Active Problem List   Diagnosis Date Noted  . 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 Obesity (BMI 30-39.9) 02/05/2019  . Neck pain 02/05/2019  . Myalgia 02/05/2019  . Chronic pain syndrome 02/05/2019  . Depression with anxiety 02/20/2018  . Paranoia (HCC) 02/20/2018  . Mania (HCC) 02/20/2018  . Chronic left-sided low back pain without sciatica 11/26/2014    Social History   Tobacco Use  . Smoking status: Former Smoker    Quit date: 02/21/2016    Years since quitting: 4.0  . Smokeless tobacco: Never Used  Substance Use Topics  . Alcohol use: Yes    Comment: about twice a year    Current Outpatient Medications:  .  OLANZapine (ZYPREXA) 5 MG tablet, Take 1 tablet (5 mg total) by mouth at bedtime., Disp:  90 tablet, Rfl: 1 .  propranolol (INDERAL) 20 MG tablet, Take 1 tablet (20 mg total) by mouth 2 (two) times daily as needed., Disp: 60 tablet, Rfl: 2 .  venlafaxine XR (EFFEXOR XR) 75 MG 24 hr capsule, Take 1 capsule (75 mg total) by mouth daily with breakfast., Disp: 90 capsule, Rfl: 0  No Known Allergies  OBJECTIVE: BP (!) 98/58 (BP Location: Left Arm, Patient Position: Sitting)   Pulse 88   Temp 98.3 F (36.8 C)   Resp 12   Ht 5' 4.02" (1.626 m)   Wt 169 lb 6.4 oz (76.8 kg)   SpO2 99%   BMI 29.06 kg/m    Gen: Afebrile. No acute distress.  HENT: AT. [place].  Eyes:Pupils Equal Round Reactive to light, Extraocular movements intact,  Conjunctiva without redness, discharge or icterus. CV: RRR no murmur, no edema, +2/4 P posterior tibialis pulses Chest: CTAB, no wheeze or crackles Neuro:  Normal gait. PERLA. EOMi. Alert. Oriented x3  Psych: Normal affect, dress and demeanor. Normal speech. Normal thought content and judgment.   ASSESSMENT AND PLAN: Valerie Walters is a 39 y.o. female present for  Mania (HCC)/Depression with anxiety/Paranoia (HCC) -Lengthy discussion today surrounding past medication trials.  She had tolerated Effexor and Zyprexa, and only had symptoms of palpitations when long prescribed/illegal medications for mixed with her prescribed medications. -Believe we can start back the Effexor low-dose and the Zyprexa nightly safely with close monitoring. Restart Effexor 75 mg daily.  Restart Zyprexa 5 mg nightly Start propranolol 20 mg twice daily as needed -Medications tried in the past: Prozac- weight gain.  zoloft, Wellbutrin, klonopin, seroquel, Valium (not effective- "scared" of ativan), Effexor, vistaril and Zyprexa-QT prolongation (together when taken with non-prescribed stimulants).  - declined trial buspar ( did not like the idea of needing daily 2-3x dosing).  - Ambulatory referral to Psychiatry had been provided-she has not established. -Patient was strongly encouraged to follow-up with [facility] as instructed.  She states she will call and make the appointment.  She knows she needs additional psychiatric help. -Follow-up 6 weeks.  Meds ordered this encounter  Medications  . OLANZapine (ZYPREXA) 5 MG tablet    Sig: Take 1 tablet (5 mg total) by mouth at bedtime.    Dispense:  90 tablet    Refill:  1  . venlafaxine XR (EFFEXOR XR) 75 MG 24 hr capsule    Sig: Take 1 capsule (75 mg total) by mouth daily with breakfast.    Dispense:  90 capsule    Refill:  0  . propranolol  (INDERAL) 20 MG tablet    Sig: Take 1 tablet (20 mg total) by mouth 2 (two) times daily as needed.    Dispense:  60 tablet    Refill:  2   Referral Orders  No referral(s) requested today     Felix Pacini, DO 03/07/2020

## 2020-03-16 ENCOUNTER — Encounter: Payer: Self-pay | Admitting: Family Medicine

## 2020-03-17 ENCOUNTER — Telehealth: Payer: Self-pay | Admitting: Family Medicine

## 2020-03-17 NOTE — Telephone Encounter (Signed)
Opened in Error.

## 2020-03-24 MED ORDER — GABAPENTIN 100 MG PO CAPS: 100.0000 mg | ORAL_CAPSULE | Freq: Three times a day (TID) | ORAL | 5 refills | Status: DC

## 2020-03-24 NOTE — Telephone Encounter (Signed)
Can you please advise if gabapentin RF is appropriate?   Thanks.

## 2020-03-24 NOTE — Telephone Encounter (Signed)
Sure, we can restart the gabapentin for her.  I have called that in for her to her pharmacy.  Start 1 tab every 8 hours.

## 2020-05-17 ENCOUNTER — Ambulatory Visit: Payer: Self-pay | Admitting: Family Medicine

## 2020-05-17 DIAGNOSIS — Z0289 Encounter for other administrative examinations: Secondary | ICD-10-CM

## 2020-06-27 ENCOUNTER — Encounter: Payer: Self-pay | Admitting: Family Medicine

## 2020-06-28 ENCOUNTER — Other Ambulatory Visit: Payer: Self-pay

## 2020-06-28 ENCOUNTER — Ambulatory Visit: Payer: Self-pay | Admitting: Family Medicine

## 2020-06-30 ENCOUNTER — Ambulatory Visit: Payer: Self-pay | Admitting: Family Medicine

## 2020-09-13 ENCOUNTER — Ambulatory Visit (INDEPENDENT_AMBULATORY_CARE_PROVIDER_SITE_OTHER): Payer: Self-pay | Admitting: Family Medicine

## 2020-09-13 ENCOUNTER — Encounter: Payer: Self-pay | Admitting: Family Medicine

## 2020-09-13 ENCOUNTER — Other Ambulatory Visit: Payer: Self-pay

## 2020-09-13 DIAGNOSIS — B370 Candidal stomatitis: Secondary | ICD-10-CM

## 2020-09-13 MED ORDER — NYSTATIN 100000 UNIT/ML MT SUSP: 5.0000 mL | Freq: Four times a day (QID) | OROMUCOSAL | 1 refills | Status: DC

## 2020-09-13 MED ORDER — FLUCONAZOLE 150 MG PO TABS: 150.0000 mg | ORAL_TABLET | Freq: Once | ORAL | 0 refills | Status: AC

## 2020-09-13 MED ORDER — GABAPENTIN 100 MG PO CAPS: 100.0000 mg | ORAL_CAPSULE | Freq: Three times a day (TID) | ORAL | 0 refills | Status: DC

## 2020-09-13 NOTE — Patient Instructions (Signed)
Oral Thrush, Adult Oral thrush is an infection in your mouth and throat and on your tongue. It causes white patches to form in your mouth and on your tongue. Many cases of thrush are mild. But, sometimes, thrush can be serious. People who have a weak body defense system (immune system) or other diseases can be affected more. What are the causes? This condition is caused by a type of fungus called yeast. The fungus is normally present in small amounts in the mouth and nose. If a person has a long-term illness or a weak body defense system, the fungus can grow and spread quickly. This causes thrush. What increases the risk? You are more likely to develop this condition if:  You have a weak body defense system.  You are an older adult.  You have diabetes, cancer, or HIV.  You have a dry mouth.  You are pregnant or breastfeeding.  You do not take good care of your teeth. This risk is greater for people who have false teeth (dentures).  You use antibiotic or steroid medicines. What are the signs or symptoms? Symptoms of this condition include:  A burning feeling in the mouth and throat.  White patches that stick to the mouth and tongue.  A bad taste in the mouth or trouble tasting foods.  A feeling like you have cotton in your mouth.  Pain when you eat and swallow.  Not wanting to eat as much as usual.  Cracking at the corners of the mouth.   How is this treated? This condition is treated with medicines called antifungals. These medicines prevent a fungus from growing. The medicines are either put right on the area (topical) or swallowed (oral). Your doctor will also treat other problems that you may have, such as diabetes or HIV. Follow these instructions at home: Medicines  Take or use over-the-counter and prescription medicines only as told by your doctor.  Ask your doctor about an over-the-counter medicine called gentian violet. Helping with pain and soreness To lessen  your pain:  Drink cold liquids, like water and iced tea.  Eat frozen ice pops or frozen juices.  Eat foods that are easy to swallow, like gelatin and ice cream.  Drink from a straw if you have too much pain in your mouth.   General instructions  Eat plain yogurt that has live cultures in it. Read the label to make sure that there are live cultures in your yogurt.  If you wear false teeth: ? Take them out before you go to bed. ? Brush them well. ? Soak them in a cleaner.  Rinse your mouth with warm salt-water many times a day. To make the salt-water mixture, dissolve -1 teaspoon (3-6 g) of salt in 1 cup (237 mL) of warm water. Contact a doctor if:  Your problems do not get better within 7 days of treatment.  Your infection is spreading. This may show as white areas on the skin outside of your mouth.  You are nursing your baby and you have redness and pain in the nipples. Summary  Oral thrush is an infection in your mouth and throat. It is caused by a fungus.  You are more likely to get this condition if you have a weak body defense system. Diseases like diabetes, cancer, or HIV also add to your risk.  This condition is treated with medicines called antifungals.  Contact a doctor if you do not get better within 7 days of starting treatment. This information   is not intended to replace advice given to you by your health care provider. Make sure you discuss any questions you have with your health care provider. Document Revised: 05/02/2019 Document Reviewed: 05/02/2019 Elsevier Patient Education  2021 [employer]  

## 2020-09-13 NOTE — Progress Notes (Signed)
This visit occurred during the SARS-CoV-2 public health emergency.  Safety protocols were in place, including screening questions prior to the visit, additional usage of staff PPE, and extensive cleaning of exam room while observing appropriate contact time as indicated for disinfecting solutions.    Valerie Walters , [birth year], 40 y.o., female MRN: [id number] Patient Care Team    Relationship Specialty Notifications Start End  Natalia Leatherwood, DO PCP - General Family Medicine  02/20/18     Chief Complaint  Patient presents with   Mouth Lesions    Pt c/o lesion on tongue  x 1 day     Subjective: Pt presents for an OV with complaints of white plaque like lesions on her tongue. She had thrush in the past and states this feels like thrush again. She denies fever, chills, pain.    Depression screen [facility] 2/9 03/03/2020 09/05/2019 02/05/2019 09/30/2018 03/18/2018  Decreased Interest 3 3 1 2 2   Down, Depressed, Hopeless 1 2 1 2 2   PHQ - 2 Score 4 5 2 4 4   Altered sleeping 3 3 0 2 1  Tired, decreased energy - 3 1 3 1   Change in appetite 2 0 1 3 0  Feeling bad or failure about yourself  3 3 1 2 3   Trouble concentrating 3 3 3 3 3   Moving slowly or fidgety/restless 0 3 0 3 0  Suicidal thoughts 0 0 0 1 0  PHQ-9 Score 15 20 8 21 12   Difficult doing work/chores Very difficult Extremely dIfficult Very difficult Extremely dIfficult Somewhat difficult    No Known Allergies Social History   Social History Narrative   Marital status/children/pets: married, 2 children   Education/employment: some college.    Safety:      -Wears a bicycle helmet riding a bike: No     -smoke alarm in the home:Yes     - wears seatbelt: Yes     - Feels safe in their relationships: Yes   Past Medical History:  Diagnosis Date   Back pain    CE tab, [facility] system visits.    Fibromyalgia    Hordeolum externum (stye)    Hyperlipidemia    Iron deficiency    Rosacea    Past Surgical History:  Procedure  Laterality Date   CESAREAN SECTION  2013   WISDOM TOOTH EXTRACTION     Family History  Problem Relation Age of Onset   Diabetes Father    Hyperlipidemia Father    Heart attack Father    Prostate cancer Maternal Grandfather        metz to bone   Heart attack Maternal Grandfather    Diabetes Paternal Grandmother    Alcohol abuse Paternal Grandfather    Heart disease Paternal Grandfather    Allergies as of 09/13/2020   No Known Allergies     Medication List       Accurate as of September 13, 2020  2:03 PM. If you have any questions, ask your nurse or doctor.        fluconazole 150 MG tablet Commonly known as: DIFLUCAN Take 1 tablet (150 mg total) by mouth once for 1 dose. Started by: , DO   gabapentin 100 MG capsule Commonly known as: NEURONTIN Take 1 capsule (100 mg total) by mouth 3 (three) times daily.   nystatin 100000 UNIT/ML suspension Commonly known as: MYCOSTATIN Take 5 mLs (500,000 Units total) by mouth 4 (four) times daily. 5 ml swish for 30  seconds and then spit out, QID, do not swallow. Started by: Felix Pacini, DO   OLANZapine 5 MG tablet Commonly known as: ZyPREXA Take 1 tablet (5 mg total) by mouth at bedtime.   propranolol 20 MG tablet Commonly known as: INDERAL Take 1 tablet (20 mg total) by mouth 2 (two) times daily as needed.   venlafaxine XR 75 MG 24 hr capsule Commonly known as: Effexor XR Take 1 capsule (75 mg total) by mouth daily with breakfast.       All past medical history, surgical history, allergies, family history, immunizations andmedications were updated in the EMR today and reviewed under the history and medication portions of their EMR.     ROS: Negative, with the exception of above mentioned in HPI   Objective:  BP 118/72    Pulse 95    Temp 98.5 F (36.9 C) (Oral)    Ht 5\' 3"  (1.6 m)    Wt 168 lb (76.2 kg)    SpO2 99%    BMI 29.76 kg/m  Body mass index is 29.76 kg/m. Gen: Afebrile. No acute distress.  Nontoxic in appearance, well developed, well nourished.  HENT: AT. [place]. MMM, white plaque like lesions, easily removed on tongue.  Eyes:Pupils Equal Round Reactive to light, Extraocular movements intact,  Conjunctiva without redness, discharge or icterus. Neck/lymp/endocrine: Supple,no lymphadenopathy.  No exam data present No results found. No results found for this or any previous visit (from the past 24 hour(s)).  Assessment/Plan: Valerie Walters is a 40 y.o. female present for OV for  Thrush Change out tooth brush.  - start nystatin (MYCOSTATIN) 100000 UNIT/ML suspension; Take 5 mLs (500,000 Units total) by mouth 4 (four) times daily. 5 ml swish for 30 seconds and then spit out, QID, do not swallow.  Dispense: 100 mL; Refill: 1 - Start fluconazole (DIFLUCAN) 150 MG tablet; Take 1 tablet (150 mg total) by mouth once for 1 dose.  Dispense: 1 tablet; Refill: 0 F/u prn  Called in a refill for gabapentin for her per her request. She was encouraged to make her chronic condition appt. She is due for her 6 mos follow up.     Reviewed expectations re: course of current medical issues.  Discussed self-management of symptoms.  Outlined signs and symptoms indicating need for more acute intervention.  Patient verbalized understanding and all questions were answered.  Patient received an After-Visit Summary.    No orders of the defined types were placed in this encounter.  Meds ordered this encounter  Medications   nystatin (MYCOSTATIN) 100000 UNIT/ML suspension    Sig: Take 5 mLs (500,000 Units total) by mouth 4 (four) times daily. 5 ml swish for 30 seconds and then spit out, QID, do not swallow.    Dispense:  100 mL    Refill:  1   fluconazole (DIFLUCAN) 150 MG tablet    Sig: Take 1 tablet (150 mg total) by mouth once for 1 dose.    Dispense:  1 tablet    Refill:  0   Referral Orders  No referral(s) requested today     Note is dictated utilizing voice recognition software.  Although note has been proof read prior to signing, occasional typographical errors still can be missed. If any questions arise, please do not hesitate to call for verification.   electronically signed by:  24, DO  [facility] [place]

## 2020-09-21 ENCOUNTER — Telehealth: Payer: Self-pay | Admitting: Family Medicine

## 2020-09-21 NOTE — Telephone Encounter (Signed)
Patient was seen 09/13/20 for thrush in her mouth. She states she was prescribed one fluconazole pill that she took. She feels the thrush is still hanging on a little bit. Patient is requesting either another fluconazole or a mouth rinse.

## 2020-09-21 NOTE — Telephone Encounter (Signed)
Please advise if appropriate without new appt

## 2020-09-30 ENCOUNTER — Other Ambulatory Visit: Payer: Self-pay

## 2020-10-01 ENCOUNTER — Encounter: Payer: Self-pay | Admitting: Family Medicine

## 2020-10-01 ENCOUNTER — Ambulatory Visit (INDEPENDENT_AMBULATORY_CARE_PROVIDER_SITE_OTHER): Payer: Self-pay | Admitting: Family Medicine

## 2020-10-01 VITALS — BP 138/88 | HR 81 | Temp 97.6°F | Ht 62.5 in | Wt 171.0 lb

## 2020-10-01 DIAGNOSIS — Z8249 Family history of ischemic heart disease and other diseases of the circulatory system: Secondary | ICD-10-CM

## 2020-10-01 DIAGNOSIS — Z131 Encounter for screening for diabetes mellitus: Secondary | ICD-10-CM

## 2020-10-01 DIAGNOSIS — E785 Hyperlipidemia, unspecified: Secondary | ICD-10-CM | POA: Insufficient documentation

## 2020-10-01 DIAGNOSIS — Z0001 Encounter for general adult medical examination with abnormal findings: Secondary | ICD-10-CM

## 2020-10-01 DIAGNOSIS — Z01419 Encounter for gynecological examination (general) (routine) without abnormal findings: Secondary | ICD-10-CM

## 2020-10-01 DIAGNOSIS — E669 Obesity, unspecified: Secondary | ICD-10-CM

## 2020-10-01 DIAGNOSIS — F22 Delusional disorders: Secondary | ICD-10-CM

## 2020-10-01 DIAGNOSIS — F309 Manic episode, unspecified: Secondary | ICD-10-CM

## 2020-10-01 DIAGNOSIS — F418 Other specified anxiety disorders: Secondary | ICD-10-CM

## 2020-10-01 DIAGNOSIS — E782 Mixed hyperlipidemia: Secondary | ICD-10-CM

## 2020-10-01 MED ORDER — PREGABALIN 75 MG PO CAPS: 75.0000 mg | ORAL_CAPSULE | Freq: Two times a day (BID) | ORAL | 3 refills | Status: DC

## 2020-10-01 MED ORDER — OLANZAPINE 5 MG PO TABS: 5.0000 mg | ORAL_TABLET | Freq: Every day | ORAL | 1 refills | Status: DC

## 2020-10-01 NOTE — Patient Instructions (Signed)
Start lyrica every 12 hours. STOP gabapentin.   Continue zyprexa.   Next appt 3 mos.     Health Maintenance, Female Adopting a healthy lifestyle and getting preventive care are important in promoting health and wellness. Ask your health care provider about:  The right schedule for you to have regular tests and exams.  Things you can do on your own to prevent diseases and keep yourself healthy. What should I know about diet, weight, and exercise? Eat a healthy diet  Eat a diet that includes plenty of vegetables, fruits, low-fat dairy products, and lean protein.  Do not eat a lot of foods that are high in solid fats, added sugars, or sodium.   Maintain a healthy weight Body mass index (BMI) is used to identify weight problems. It estimates body fat based on height and weight. Your health care provider can help determine your BMI and help you achieve or maintain a healthy weight. Get regular exercise Get regular exercise. This is one of the most important things you can do for your health. Most adults should:  Exercise for at least 150 minutes each week. The exercise should increase your heart rate and make you sweat (moderate-intensity exercise).  Do strengthening exercises at least twice a week. This is in addition to the moderate-intensity exercise.  Spend less time sitting. Even light physical activity can be beneficial. Watch cholesterol and blood lipids Have your blood tested for lipids and cholesterol at 39 years of age, then have this test every 5 years. Have your cholesterol levels checked more often if:  Your lipid or cholesterol levels are high.  You are older than 39 years of age.  You are at high risk for heart disease. What should I know about cancer screening? Depending on your health history and family history, you may need to have cancer screening at various ages. This may include screening for:  Breast cancer.  Cervical cancer.  Colorectal cancer.  Skin  cancer.  Lung cancer. What should I know about heart disease, diabetes, and high blood pressure? Blood pressure and heart disease  High blood pressure causes heart disease and increases the risk of stroke. This is more likely to develop in people who have high blood pressure readings, are of African descent, or are overweight.  Have your blood pressure checked: ? Every 3-5 years if you are 57-23 years of age. ? Every year if you are 28 years old or older. Diabetes Have regular diabetes screenings. This checks your fasting blood sugar level. Have the screening done:  Once every three years after age 57 if you are at a normal weight and have a low risk for diabetes.  More often and at a younger age if you are overweight or have a high risk for diabetes. What should I know about preventing infection? Hepatitis B If you have a higher risk for hepatitis B, you should be screened for this virus. Talk with your health care provider to find out if you are at risk for hepatitis B infection. Hepatitis C Testing is recommended for:  Everyone born from [birth year] through 1964.  Anyone with known risk factors for hepatitis C. Sexually transmitted infections (STIs)  Get screened for STIs, including gonorrhea and chlamydia, if: ? You are sexually active and are younger than 39 years of age. ? You are older than 39 years of age and your health care provider tells you that you are at risk for this type of infection. ? Your sexual activity has  changed since you were last screened, and you are at increased risk for chlamydia or gonorrhea. Ask your health care provider if you are at risk.  Ask your health care provider about whether you are at high risk for HIV. Your health care provider may recommend a prescription medicine to help prevent HIV infection. If you choose to take medicine to prevent HIV, you should first get tested for HIV. You should then be tested every 3 months for as long as you are taking  the medicine. Pregnancy  If you are about to stop having your period (premenopausal) and you may become pregnant, seek counseling before you get pregnant.  Take 400 to 800 micrograms (mcg) of folic acid every day if you become pregnant.  Ask for birth control (contraception) if you want to prevent pregnancy. Osteoporosis and menopause Osteoporosis is a disease in which the bones lose minerals and strength with aging. This can result in bone fractures. If you are 78 years old or older, or if you are at risk for osteoporosis and fractures, ask your health care provider if you should:  Be screened for bone loss.  Take a calcium or vitamin D supplement to lower your risk of fractures.  Be given hormone replacement therapy (HRT) to treat symptoms of menopause. Follow these instructions at home: Lifestyle  Do not use any products that contain nicotine or tobacco, such as cigarettes, e-cigarettes, and chewing tobacco. If you need help quitting, ask your health care provider.  Do not use street drugs.  Do not share needles.  Ask your health care provider for help if you need support or information about quitting drugs. Alcohol use  Do not drink alcohol if: ? Your health care provider tells you not to drink. ? You are pregnant, may be pregnant, or are planning to become pregnant.  If you drink alcohol: ? Limit how much you use to 0-1 drink a day. ? Limit intake if you are breastfeeding.  Be aware of how much alcohol is in your drink. In the [place] one drink equals one 12 oz bottle of beer (355 mL), one 5 oz glass of wine (148 mL), or one 1 oz glass of hard liquor (44 mL). General instructions  Schedule regular health, dental, and eye exams.  Stay current with your vaccines.  Tell your health care provider if: ? You often feel depressed. ? You have ever been abused or do not feel safe at home. Summary  Adopting a healthy lifestyle and getting preventive care are important in  promoting health and wellness.  Follow your health care provider's instructions about healthy diet, exercising, and getting tested or screened for diseases.  Follow your health care provider's instructions on monitoring your cholesterol and blood pressure. This information is not intended to replace advice given to you by your health care provider. Make sure you discuss any questions you have with your health care provider. Document Revised: 06/19/2018 Document Reviewed: 06/19/2018 Elsevier Patient Education  2021 [employer].

## 2020-10-01 NOTE — Progress Notes (Signed)
This visit occurred during the SARS-CoV-2 public health emergency.  Safety protocols were in place, including screening questions prior to the visit, additional usage of staff PPE, and extensive cleaning of exam room while observing appropriate contact time as indicated for disinfecting solutions.    Patient ID: Valerie Walters, female  DOB: August 25, 1980, 40 y.o.   MRN: [id number] Patient Care Team    Relationship Specialty Notifications Start End  Natalia Leatherwood, DO PCP - General Family Medicine  02/20/18     Chief Complaint  Patient presents with  . Annual Exam    Pt is fasting;     Subjective:  Valerie Walters is a 40 y.o.  Female  present for CPE/CMC. All past medical history, surgical history, allergies, family history, immunizations, medications and social history were updated in the electronic medical record today. All recent labs, ED visits and hospitalizations within the last year were reviewed.  Health maintenance:  Colonoscopy: no fhx. Routine screen 46 Mammogram: no fhx. Routine screen at 41 Cervical cancer screening: last pap: overdue. Refer to gyn Immunizations: tdap UTD 2016, Influenza declined (encouraged yearly), covid x2. Booster counseled.  Infectious disease screening: HIV and  Hep C completed DEXA: routine screen Assistive device: none Oxygen KGM:WNUU Patient has a Dental home. Hospitalizations/ED visits: reviewed  depression and anxiety.  She stopped effexor- did like the way it made her feel. She had been on effexor prior without complaints. She stopped propanolol, did not feel it was helpful and went back to gabapentin (which she does not feel is helpful). She has continued to take zyprexa qhs and feels it is helpful. She is having martial problems and is going through a divorce. She is tearful today.  Prior Note: She has experienced paranoid features and delusions.  Patient would like to be placed back on Effexor and Zyprexa.  These medications were removed  after she had a complaint of palpitations and possible QT prolongation during ED visit-she had been on Vistaril as well.  Of note, she was also on nonprescribed stimulants at that time as well.  Patient's regimen had been switched over to Abilify, gabapentin and Vistaril secondary to ED findings.  She states she was not well controlled on that medication and did not feel it was helpful.  She has not taken Abilify in quite some time.  She had some old Zyprexa left over and had been using that on occasions nightly. She reports she still having some paranoid thoughts, which she feels is not as bad as it has been in the past.  She is still with her husband.   Depression screen [facility] 2/9 10/01/2020 03/03/2020 09/05/2019 02/05/2019 09/30/2018  Decreased Interest 1 3 3 1 2   Down, Depressed, Hopeless 2 1 2 1 2   PHQ - 2 Score 3 4 5 2 4   Altered sleeping 2 3 3  0 2  Tired, decreased energy 2 - 3 1 3   Change in appetite 3 2 0 1 3  Feeling bad or failure about yourself  2 3 3 1 2   Trouble concentrating 3 3 3 3 3   Moving slowly or fidgety/restless 2 0 3 0 3  Suicidal thoughts 1 0 0 0 1  PHQ-9 Score 18 15 20 8 21   Difficult doing work/chores - Very difficult Extremely dIfficult Very difficult Extremely dIfficult   GAD 7 : Generalized Anxiety Score 10/01/2020 03/03/2020 09/05/2019 02/05/2019  Nervous, Anxious, on Edge 3 3 3 2   Control/stop worrying 3 3 3  0  Worry too much -  different things 2 3 3 3   Trouble relaxing 3 3 3 3   Restless 3 2 1  0  Easily annoyed or irritable 2 3 3 3   Afraid - awful might happen 2 2 3 2   Total GAD 7 Score 18 19 19 13   Anxiety Difficulty Extremely difficult Extremely difficult Extremely difficult Extremely difficult    Immunization History  Administered Date(s) Administered  . Influenza, Seasonal, Injecte, Preservative Fre 03/18/2018  . Influenza,inj,Quad PF,6+ Mos 03/18/2018  . Influenza-Unspecified 05/13/2012  . PFIZER(Purple Top)SARS-COV-2 Vaccination 01/01/2020, 02/06/2020  .  Tdap 04/12/2003, 07/22/2014     Past Medical History:  Diagnosis Date  . Back pain    CE tab, [facility] system visits.   . Fibromyalgia   . Hordeolum externum (stye)   . Hyperlipidemia   . Iron deficiency   . Rosacea    No Known Allergies Past Surgical History:  Procedure Laterality Date  . CESAREAN SECTION  2013  . WISDOM TOOTH EXTRACTION     Family History  Problem Relation Age of Onset  . Diabetes Father   . Hyperlipidemia Father   . Heart attack Father   . Prostate cancer Maternal Grandfather        metz to bone  . Heart attack Maternal Grandfather   . Diabetes Paternal Grandmother   . Alcohol abuse Paternal Grandfather   . Heart disease Paternal Grandfather    Social History   Social History Narrative   Marital status/children/pets: married, 2 children   Education/employment: some college.    Safety:      -Wears a bicycle helmet riding a bike: No     -smoke alarm in the home:Yes     - wears seatbelt: Yes     - Feels safe in their relationships: Yes    Allergies as of 10/01/2020   No Known Allergies     Medication List       Accurate as of October 01, 2020 11:41 AM. If you have any questions, ask your nurse or doctor.        STOP taking these medications   gabapentin 100 MG capsule Commonly known as: NEURONTIN Stopped by: 02/08/2020, DO   nystatin 100000 UNIT/ML suspension Commonly known as: MYCOSTATIN Stopped by: 06/12/2003, DO   propranolol 20 MG tablet Commonly known as: INDERAL Stopped by: 07/24/2014, DO   venlafaxine XR 75 MG 24 hr capsule Commonly known as: Effexor XR Stopped by: 2014, DO     TAKE these medications   OLANZapine 5 MG tablet Commonly known as: ZyPREXA Take 1 tablet (5 mg total) by mouth at bedtime.   pregabalin 75 MG capsule Commonly known as: Lyrica Take 1 capsule (75 mg total) by mouth 2 (two) times daily. Started by: 10/03/2020, DO       All past medical history, surgical history, allergies,  family history, immunizations andmedications were updated in the EMR today and reviewed under the history and medication portions of their EMR.     No results found for this or any previous visit (from the past 2160 hour(s)).  No results found.   ROS: 14 pt review of systems performed and negative (unless mentioned in an HPI)  Objective: BP 138/88   Pulse 81   Temp 97.6 F (36.4 C) (Oral)   Ht 5' 2.5" (1.588 m)   Wt 171 lb (77.6 kg)   SpO2 100%   BMI 30.78 kg/m  Gen: Afebrile. No acute distress. Nontoxic in appearance, well-developed, well-nourished,  Obese  pleasant female HENT: AT. [place]. Bilateral TM visualized and normal in appearance, normal external auditory canal. MMM, no oral lesions, adequate dentition. Bilateral nares within normal limits. Throat without erythema, ulcerations or exudates. no Cough on exam, no hoarseness on exam. Eyes:Pupils Equal Round Reactive to light, Extraocular movements intact,  Conjunctiva without redness, discharge or icterus. Neck/lymp/endocrine: Supple,no lymphadenopathy, no thyromegaly CV: RRR no murmur, no edema, +2/4 P posterior tibialis pulses.  Chest: CTAB, no wheeze, rhonchi or crackles. normal Respiratory effort. good Air movement. Abd: Soft. flat. NTND. BS present. no Masses palpated. No hepatosplenomegaly. No rebound tenderness or guarding. Skin: no rashes, purpura or petechiae. Warm and well-perfused. Skin intact. Neuro/Msk:  Normal gait. PERLA. EOMi. Alert. Oriented x3.  Cranial nerves II through XII intact. Muscle strength 5/5 upper/lower extremity. DTRs equal bilaterally. Psych: tearful. Otherwise, Normal affect, dress and demeanor. Normal speech. Normal thought content and judgment.   No exam data present  Assessment/plan: Valerie Walters is a 40 y.o. 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  She had tolerated Effexor and Zyprexa, and  only had symptoms of palpitations when when taking  illegal medications mixed with her prescribed medications. Continue  Zyprexa 5 mg nightly Stop gabapentin> start lyrica 75 mg bid.  -Medications tried in the past: propanolol- did not work well. effexor- tolerated prior, but now does not like med. Prozac- weight gain. zoloft, Wellbutrin, klonopin, seroquel, Valium (not effective- "scared" of ativan), Effexor, vistaril and Zyprexa-QT prolongation (together when taken with non-prescribed stimulants).  - declined trial buspar ( did not like the idea of needing daily 2-3x dosing).  - Ambulatory referral to Psychiatry had been provided-she est with [facility]- but stopped going. She refuses to est with psychiatry for her condition and she does not want to speak to therapist. Patient fully understands these are recommended for her by this provider.  F/u 3 nos   Mixed hyperlipidemia/ Family history of heart diseaseObesity (BMI 30-39.9)/ She is exercising routinely.  - CBC with Differential/Platelet - Comprehensive metabolic panel - Lipid panel - TSH  Diabetes mellitus screening - Hemoglobin A1c Visit for pelvic exam - Ambulatory referral to Gynecology  Encounter for general adult medical examination with abnormal findings Patient was encouraged to exercise greater than 150 minutes a week. Patient was encouraged to choose a diet filled with fresh fruits and vegetables, and lean meats. AVS provided to patient today for education/recommendation on gender specific health and safety maintenance. Colonoscopy: no fhx. Routine screen 46 Mammogram: no fhx. Routine screen at 41 Cervical cancer screening: last pap: overdue. Refer to gyn Immunizations: tdap UTD 2016, Influenza declined (encouraged yearly), covid x2. Booster counseled.  Infectious disease screening: HIV and  Hep C completed DEXA: routine screen    Orders Placed This Encounter  Procedures  . CBC with Differential/Platelet  .  Comprehensive metabolic panel  . Hemoglobin A1c  . Lipid panel  . TSH  . Ambulatory referral to Gynecology   Meds ordered this encounter  Medications  . OLANZapine (ZYPREXA) 5 MG tablet    Sig: Take 1 tablet (5 mg total) by mouth at bedtime.    Dispense:  90 tablet    Refill:  1  . pregabalin (LYRICA) 75 MG capsule    Sig: Take 1 capsule (75 mg total) by mouth 2 (two) times daily.    Dispense:  60 capsule    Refill:  3    Referral Orders     Ambulatory referral to  Gynecology   Electronically signed by: Felix Pacinienee Kuneff, DO [facility]

## 2020-10-04 ENCOUNTER — Ambulatory Visit: Payer: Self-pay

## 2021-03-07 ENCOUNTER — Telehealth: Payer: Self-pay

## 2021-03-07 NOTE — Telephone Encounter (Signed)
Spoke with pt regarding medication request. Informed pt that she was taken off rx in march. Pt states that she liked the Effexor but and switched back herself. Pt was informed the medication cannot be refilled at this time but will send note to provider.   FYI.   PCP last note:  Medications tried in the past: propanolol- did not work well. effexor- tolerated prior, but now does not like med. Prozac- weight gain.  zoloft, Wellbutrin, klonopin, seroquel, Valium (not effective- "scared" of ativan), Effexor, vistaril and Zyprexa-QT prolongation (together when taken with non-prescribed stimulants).  - declined trial buspar ( did not like the idea of needing daily 2-3x dosing).  - Ambulatory referral to Psychiatry had been provided-she est with [facility]- but stopped going. She refuses to est with psychiatry for her condition and she does not want to speak to therapist. Patient fully understands these are recommended for her by this provider.  F/u 3 nos

## 2021-03-07 NOTE — Telephone Encounter (Signed)
Patient refill request.  States she only has a couple of days left of meds.  I scheduled an appt for her with Dr. Claiborne Billings on 9/20.  venlafaxine XR (EFFEXOR XR) 75 MG 24 hr capsule  Update: Preferred pharmacy is  [facility]

## 2021-03-08 MED ORDER — VENLAFAXINE HCL ER 75 MG PO CP24: 75.0000 mg | ORAL_CAPSULE | Freq: Every day | ORAL | 0 refills | Status: DC

## 2021-03-08 NOTE — Addendum Note (Signed)
Addended by: Felix Pacini A on: 03/08/2021 12:23 PM   Modules accepted: Orders

## 2021-03-08 NOTE — Telephone Encounter (Signed)
Refilled Effexor for 30 days.  Follow-up as needed prior to any further refills. Thanks

## 2021-03-08 NOTE — Telephone Encounter (Signed)
Pt aware.

## 2021-03-29 ENCOUNTER — Telehealth: Payer: Self-pay | Admitting: Family Medicine

## 2021-03-29 ENCOUNTER — Encounter: Payer: Self-pay | Admitting: Family Medicine

## 2021-03-29 ENCOUNTER — Ambulatory Visit: Payer: Self-pay | Admitting: Family Medicine

## 2021-04-12 NOTE — Telephone Encounter (Signed)
error 

## 2021-06-13 ENCOUNTER — Telehealth: Payer: Self-pay | Admitting: Family Medicine

## 2021-06-13 NOTE — Telephone Encounter (Signed)
Pt called and wanted to know if she can get a few month supply on her OLANZapine (ZYPREXA) 5 MG tablet to last her to get another provider since she was dismissed from here. Please advise. Callback number [phone]

## 2021-06-13 NOTE — Telephone Encounter (Signed)
Please advise 

## 2021-06-14 MED ORDER — OLANZAPINE 5 MG PO TABS: 5.0000 mg | ORAL_TABLET | Freq: Every day | ORAL | 0 refills | Status: DC

## 2021-06-14 NOTE — Telephone Encounter (Signed)
Please inform patient I refilled her prescription for 90 days.

## 2021-06-14 NOTE — Telephone Encounter (Signed)
Pt informed rx sent.

## 2021-06-14 NOTE — Addendum Note (Signed)
Addended by: Felix Pacini A on: 06/14/2021 04:37 PM   Modules accepted: Orders

## 2021-08-02 ENCOUNTER — Ambulatory Visit (INDEPENDENT_AMBULATORY_CARE_PROVIDER_SITE_OTHER): Payer: Self-pay | Admitting: Medical-Surgical

## 2021-08-02 ENCOUNTER — Other Ambulatory Visit: Payer: Self-pay

## 2021-08-02 ENCOUNTER — Encounter: Payer: Self-pay | Admitting: Medical-Surgical

## 2021-08-02 VITALS — BP 120/70 | HR 69 | Temp 97.8°F | Resp 20 | Ht 63.5 in | Wt 164.4 lb

## 2021-08-02 DIAGNOSIS — F1594 Other stimulant use, unspecified with stimulant-induced mood disorder: Secondary | ICD-10-CM

## 2021-08-02 DIAGNOSIS — Z7689 Persons encountering health services in other specified circumstances: Secondary | ICD-10-CM

## 2021-08-02 DIAGNOSIS — F22 Delusional disorders: Secondary | ICD-10-CM

## 2021-08-02 DIAGNOSIS — F309 Manic episode, unspecified: Secondary | ICD-10-CM

## 2021-08-02 MED ORDER — OLANZAPINE 5 MG PO TABS: 5.0000 mg | ORAL_TABLET | Freq: Every day | ORAL | 0 refills | Status: DC

## 2021-08-02 NOTE — Patient Instructions (Signed)
Reasons why it is important to allow yourself to process and experience true feelings: When you numb sadness, you also numb happiness and Valerie Walters. Struggling with your emotions often leads to more suffering. Processing and experiencing your feelings is a part of having a full life.    Coping skills are things we can do to make ourselves feel better when we are going through difficult times. Examples of coping skills include:  Take a deep breath Count to 20 Listen to music Call a friend Take a walk Read a book Do a puzzle Meditate Journal Exercise Stretch Sing Bake Knit Go outside Garden Pray Color Send a note Take a bath Watch a movie Pet an animal Visit a friend Be alone in a quiet place   When your anxiety gets worse, try these grounding techniques:      If you need additional help, please contact your primary care provider or call one of the resources listed below:  [facility]  24-hour HelpLine at 336-832-9700 or 800-711-2635 [address] [place],  [address]  National [facility] Network 800-SUICIDE or 800-784-2433  The National Suicide Prevention Lifeline 800-273-TALK or 800-273-8255  [facility] https://www.celebraterecovery.com/   

## 2021-08-02 NOTE — Progress Notes (Signed)
New Patient Office Visit  Subjective:  Patient ID: Marlisa Caridi, female    DOB: 1981-01-28  Age: 41 y.o. MRN: [id number]  CC:  Chief Complaint  Patient presents with   Establish Care     HPI Tykesha Konicki presents to establish care. She is a pleasant 41 year old female accompanied by her husband, Tammy Sours. She has been treated with various medications for mental health concerns but is currently only taking Zyprexa 5mg  nightly. Received a prescription for 90 day supply in December but reports she dropped the bottle with the lid off in the car and the pills spilled into the crack at the seat rail carpet and quite a few of them dropped into it and cannot be reached. Was taking Effexor but did not tolerate this so stopped it. Has chronic sleep problems but the Zyprexa is helpful. Endorses passive thoughts of SI with no intent to carry through and no plan in place. Denies HI.  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  Notes that she is working hard to stay away from it although she does have some people in her life that are bad influences.  Past Medical History:  Diagnosis Date   Anxiety    Back pain    CE tab, [facility] system visits.    Depression    Fibromyalgia    Hordeolum externum (stye)    Hyperlipidemia    Iron deficiency    Rosacea     Past Surgical History:  Procedure Laterality Date   CESAREAN SECTION  2013   WISDOM TOOTH EXTRACTION      Family History  Problem Relation Age of Onset   Diabetes Father    Hyperlipidemia Father    Heart attack Father    Prostate cancer Maternal Grandfather        metz to bone   Heart attack Maternal Grandfather    Diabetes Paternal Grandmother    Alcohol abuse Paternal Grandfather    Heart disease Paternal Grandfather     Social History   Socioeconomic History   Marital status: Married    Spouse name: 2014   Number of children: 2   Years of education: Not on file   Highest education level: Not on file   Occupational History   Occupation: stay at home mother  Tobacco Use   Smoking status: Former    Types: Cigarettes    Quit date: 02/21/2016    Years since quitting: 5.4   Smokeless tobacco: Never  Vaping Use   Vaping Use: Never used  Substance and Sexual Activity   Alcohol use: Not Currently   Drug use: Not Currently    Types: Methamphetamines    Comment: meth last night.   Sexual activity: Yes    Partners: Male    Birth control/protection: None  Other Topics Concern   Not on file  Social History Narrative   Marital status/children/pets: married, 2 children   Education/employment: some college.    Safety:      -Wears a bicycle helmet riding a bike: No     -smoke alarm in the home:Yes     - wears seatbelt: Yes     - Feels safe in their relationships: Yes   Social Determinants of Health   Financial Resource Strain: Not on file  Food Insecurity: Not on file  Transportation Needs: Not on file  Physical Activity: Not on file  Stress: Not on file  Social Connections: Not on file  Intimate Partner Violence:  Not on file    ROS Review of Systems  Constitutional:  Negative for chills, fatigue, fever and unexpected weight change.  HENT:  Negative for congestion, rhinorrhea, sinus pressure and sore throat.   Respiratory:  Negative for cough, chest tightness and shortness of breath.   Cardiovascular:  Negative for chest pain, palpitations and leg swelling.  Gastrointestinal:  Negative for abdominal pain, constipation, diarrhea, nausea and vomiting.  Endocrine: Negative for cold intolerance, heat intolerance, polydipsia, polyphagia and polyuria.  Genitourinary:  Negative for dysuria, frequency, urgency, vaginal bleeding and vaginal discharge.  Skin:  Negative for rash and wound.  Neurological:  Negative for dizziness, light-headedness and headaches.  Hematological:  Does not bruise/bleed easily.  Psychiatric/Behavioral:  Positive for dysphoric mood. Negative for self-injury,  sleep disturbance and suicidal ideas. The patient is nervous/anxious.    Objective:   Today's Vitals: BP 120/70    Pulse 69    Temp 97.8 F (36.6 C) (Oral)    Resp 20    Ht 5' 3.5" (1.613 m)    Wt 164 lb 6.4 oz (74.6 kg)    LMP 07/29/2021    SpO2 100%    BMI 28.67 kg/m   Physical Exam Vitals reviewed.  Constitutional:      General: She is not in acute distress.    Appearance: Normal appearance.  HENT:     Head: Normocephalic and atraumatic.  Cardiovascular:     Rate and Rhythm: Normal rate and regular rhythm.     Pulses: Normal pulses.     Heart sounds: Normal heart sounds. No murmur heard.   No friction rub. No gallop.  Pulmonary:     Effort: Pulmonary effort is normal. No respiratory distress.     Breath sounds: Normal breath sounds. No wheezing.  Skin:    General: Skin is warm and dry.  Neurological:     Mental Status: She is alert and oriented to person, place, and time.  Psychiatric:        Mood and Affect: Mood normal.        Behavior: Behavior normal.        Thought Content: Thought content normal.        Judgment: Judgment normal.    Assessment & Plan:   1. Encounter to establish care Reviewed available information and discussed care concerns with patient.   2. Mania (HCC) 3. Paranoia (HCC) Patient feels these are fairly well controlled.  She is happy with her current regimen of using Zyprexa only and feels that this works better than the multiple medications she has tried in the past.  Refilling Zyprexa 5 mg nightly.  90-day supply sent.  4. Stimulant-induced mood disorder (HCC) Continue to avoid stimulants.  Emergency behavioral health resources provided and discussed at length.  She is very early in her recovery and there is a high likelihood for relapse.  May need to consider inpatient rehabilitation versus aggressive outpatient counseling and psychiatry.  Insurance coverage is the limiting factor at this point.  They are in the process of applying for Medicaid  and have hopes to have insurance soon.  At that time, she is happy to be referred to counseling as well as psychiatry.  They will let me know when insurance is in place and we can go ahead and place those referrals.  Outpatient Encounter Medications as of 08/02/2021  Medication Sig   [DISCONTINUED] OLANZapine (ZYPREXA) 5 MG tablet Take 1 tablet (5 mg total) by mouth at bedtime.   OLANZapine (ZYPREXA) 5  MG tablet Take 1 tablet (5 mg total) by mouth at bedtime.   [DISCONTINUED] gabapentin (NEURONTIN) 100 MG capsule Take 100 mg by mouth 3 (three) times daily.   [DISCONTINUED] pregabalin (LYRICA) 75 MG capsule Take 1 capsule (75 mg total) by mouth 2 (two) times daily.   [DISCONTINUED] venlafaxine XR (EFFEXOR XR) 75 MG 24 hr capsule Take 1 capsule (75 mg total) by mouth daily with breakfast.   No facility-administered encounter medications on file as of 08/02/2021.    Follow-up: Return in about 3 months (around 10/31/2021) for mood follow up.   Thayer Ohm, DNP, APRN, FNP-BC [facility]

## 2021-11-15 ENCOUNTER — Ambulatory Visit (INDEPENDENT_AMBULATORY_CARE_PROVIDER_SITE_OTHER): Payer: Self-pay | Admitting: Medical-Surgical

## 2021-11-15 ENCOUNTER — Encounter: Payer: Self-pay | Admitting: Medical-Surgical

## 2021-11-15 VITALS — BP 130/81 | HR 81 | Resp 20 | Ht 63.5 in | Wt 173.1 lb

## 2021-11-15 DIAGNOSIS — R300 Dysuria: Secondary | ICD-10-CM

## 2021-11-15 DIAGNOSIS — F418 Other specified anxiety disorders: Secondary | ICD-10-CM

## 2021-11-15 LAB — POCT URINALYSIS DIPSTICK
Bilirubin, UA: NEGATIVE
Glucose, UA: NEGATIVE
Ketones, UA: NEGATIVE
Leukocytes, UA: NEGATIVE
Nitrite, UA: NEGATIVE
Protein, UA: NEGATIVE
Spec Grav, UA: 1.02 (ref 1.010–1.025)
Urobilinogen, UA: 0.2 E.U./dL
pH, UA: 7 (ref 5.0–8.0)

## 2021-11-15 LAB — WET PREP FOR TRICH, YEAST, CLUE
MICRO NUMBER:: 13370931
Specimen Quality: ADEQUATE

## 2021-11-15 IMAGING — DX DG CHEST 2V
2 series · 2 of 2 positions shown · non-contrast
Comparison: None.

CLINICAL DATA: Anxiety and paranoia.

EXAM:
CHEST - 2 VIEW

[chest pa]
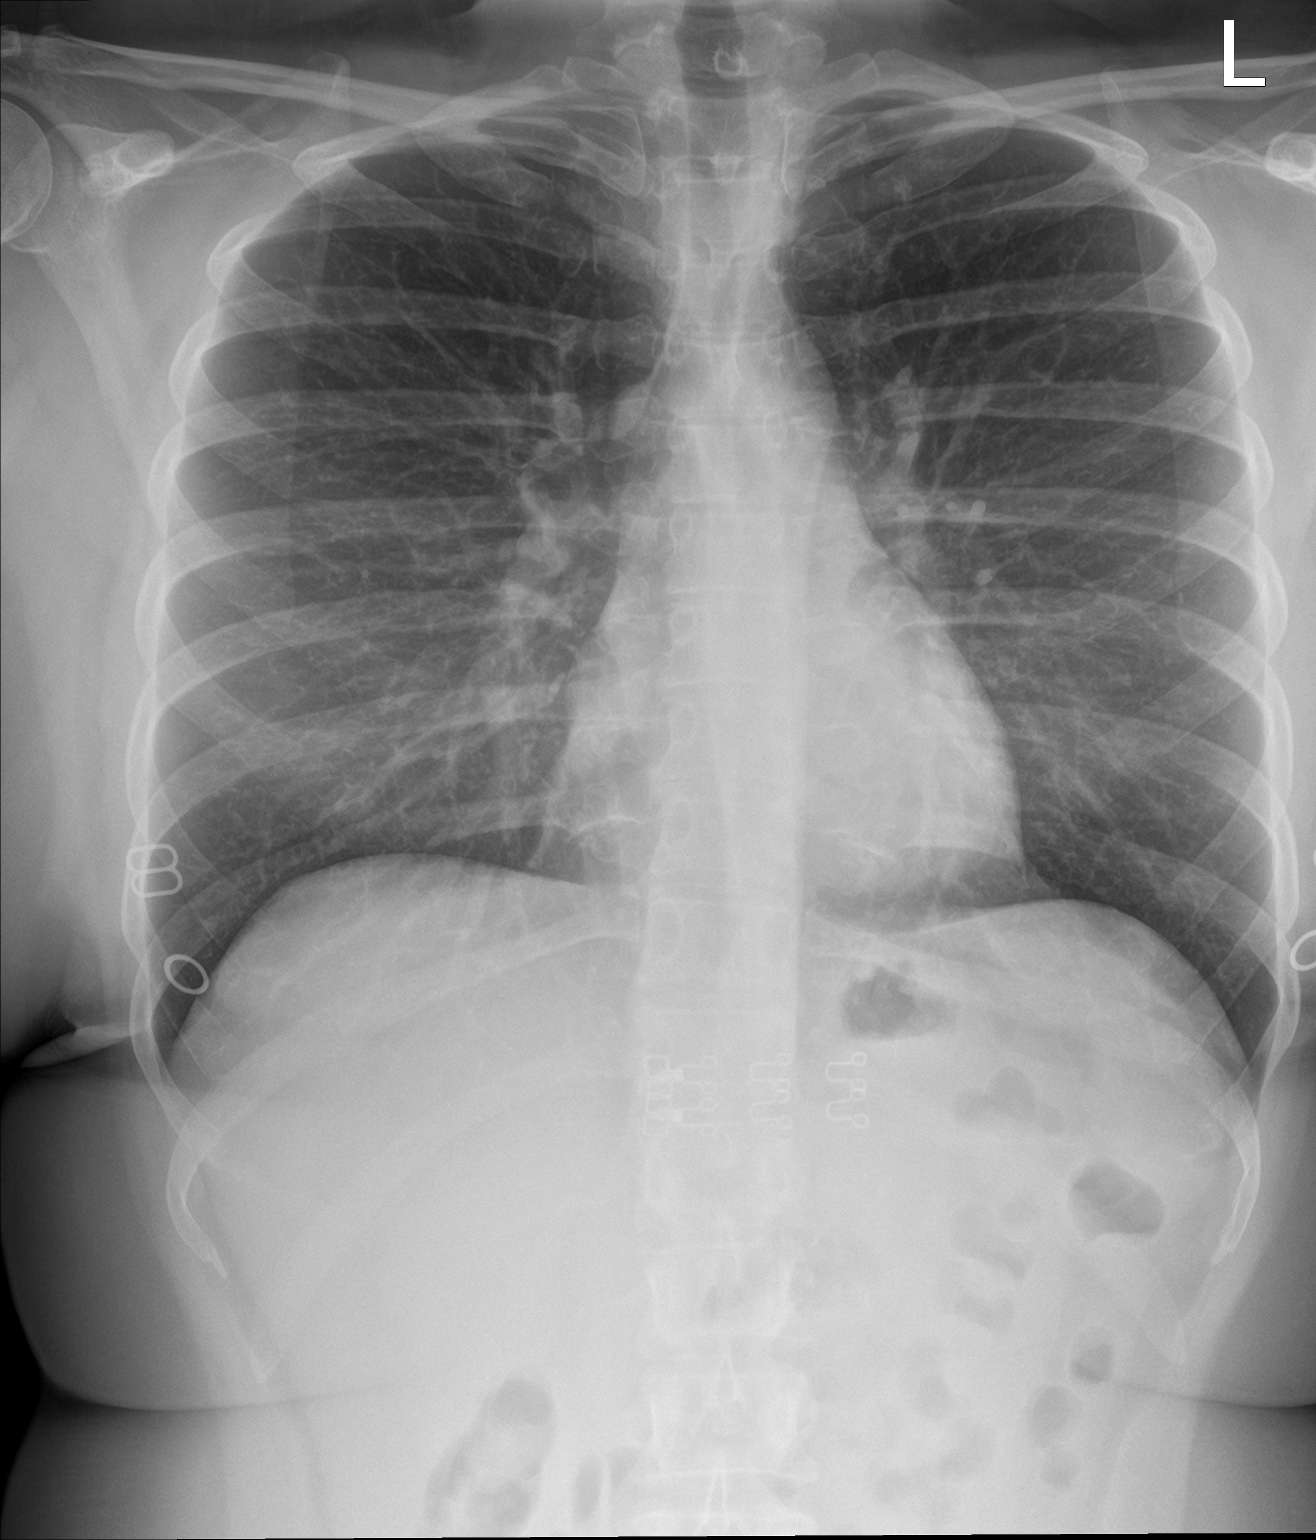

[chest lat]
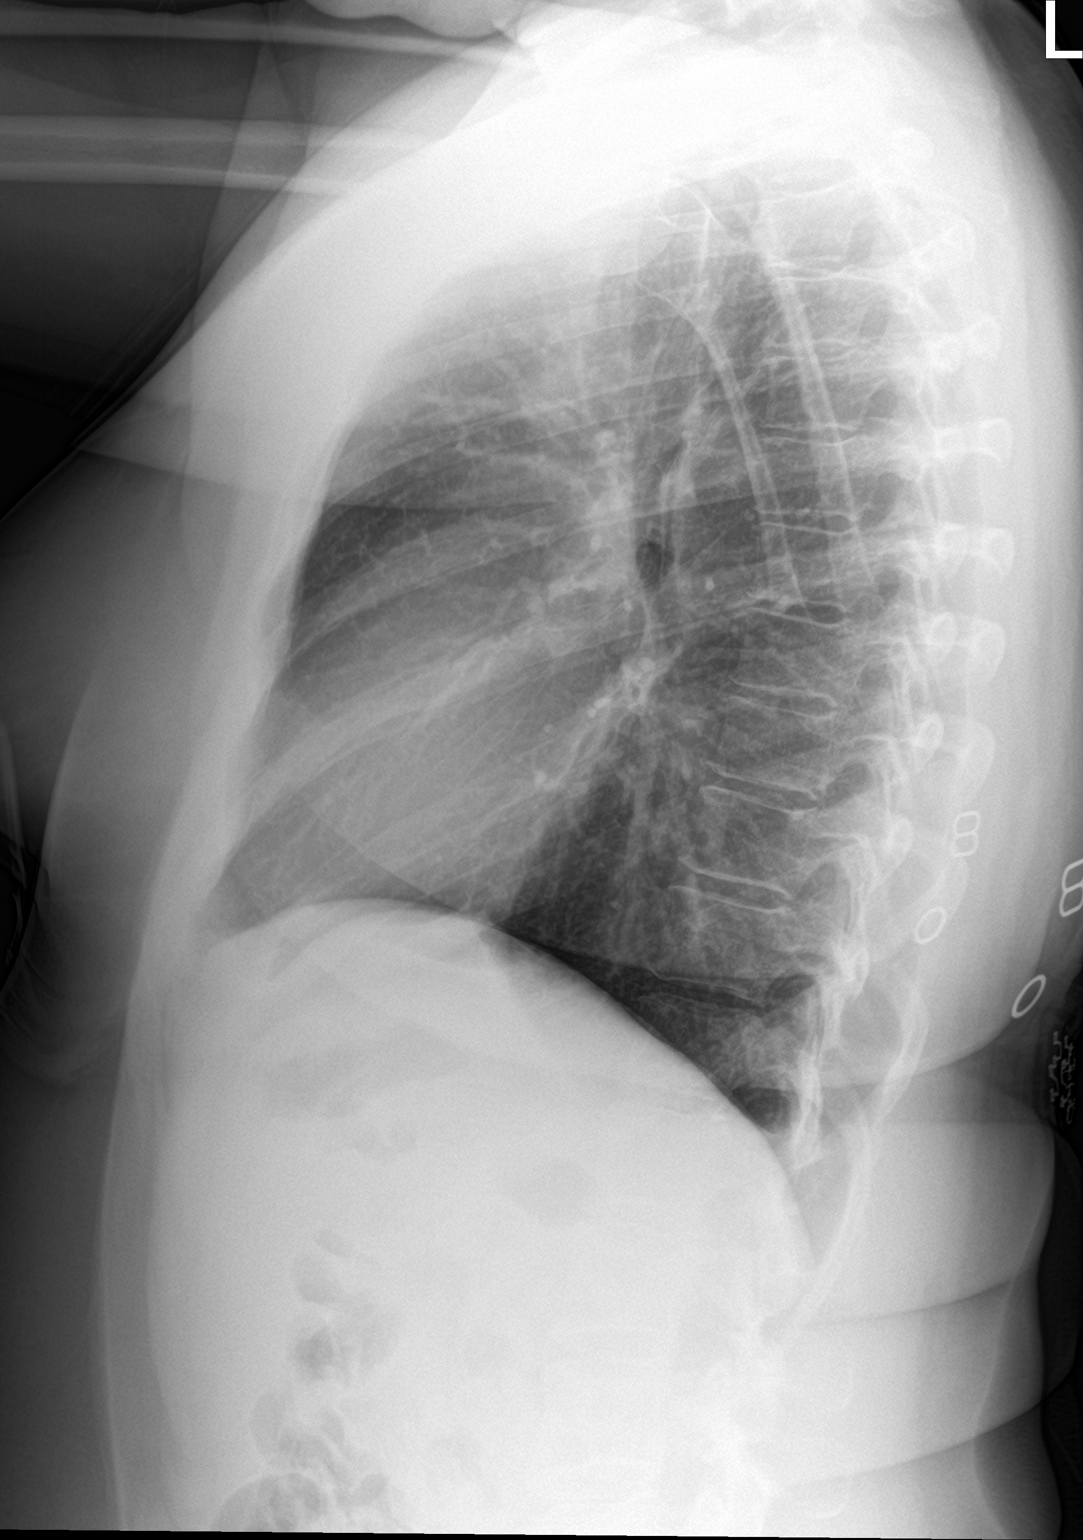

[2 of 2 positions shown; findings below may reference images not displayed]

FINDINGS: The heart size and mediastinal contours are within normal limits.
Both lungs are clear. The visualized skeletal structures are
unremarkable.
IMPRESSION: No active cardiopulmonary disease.

## 2021-11-15 MED ORDER — PROPRANOLOL HCL 20 MG PO TABS
20.0000 mg | ORAL_TABLET | Freq: Three times a day (TID) | ORAL | 0 refills | Status: DC | PRN
Start: 2021-11-15 — End: 2022-07-26

## 2021-11-15 NOTE — Progress Notes (Signed)
?  HPI with pertinent ROS:  ? ?CC: Possible UTI ? ?HPI: ?Pleasant 41 year old female presenting today with suspicion of UTI.  Notes that she has had an increase in urinary frequency and incomplete emptying.  She has been feeling the urge to void and being able to go however she feels like she never empties completely.  Has had nausea for the last day and a half as well as some back pain.  Denies burning, fever, chills, urinary odor, hematuria, and vomiting.  Notes that she feels bloated in the abdominal area and her pants no longer fit since she feels like she has gained 9 pounds overnight.  She did have a bowel movement this morning but notes that it was a very strange consistency and describes it as being claylike in texture. ? ?I reviewed the past medical history, family history, social history, surgical history, and allergies today and no changes were needed.  Please see the problem list section below in epic for further details. ? ? ?Physical exam:  ? ?General: Well Developed, well nourished, and in no acute distress.  ?Neuro: Alert and oriented x3.  ?HEENT: Normocephalic, atraumatic.  ?Skin: Warm and dry. ?Cardiac: Regular rate and rhythm, no murmurs rubs or gallops, no lower extremity edema.  ?Respiratory: Clear to auscultation bilaterally. Not using accessory muscles, speaking in full sentences. ? ?Impression and Recommendations:   ? ?1. Dysuria ?POCT urinalysis positive for trace intact blood but otherwise negative.  Sending for culture.  Wet prep for BV and yeast.  Sending urine for gonorrhea and chlamydia.  Suspect that constipation may be contributing to some of her urinary symptoms as well as her abdominal bloating.  Recommend starting MiraLAX 17 g twice daily for the next 3 to 5 days until she is going well. ?- POCT Urinalysis Dipstick ?- C. trachomatis/N. gonorrhoeae RNA ?- WET PREP FOR TRICH, YEAST, CLUE ?- Urine Culture ? ?2.  Depression with anxiety ?Reports that she continues to take Zyprexa 5 mg  nightly as prescribed.  She does still have some significant psychiatric concerns.  Her presentation today is accompanied by some agitation and tics.  She notes that she has last used marijuana a week ago but denies any other illicit substance use.  Not currently doing counseling or seeing a psychiatrist.  Would recommend getting established with both of these providers due to the complexity of her mental health concerns.  For now, will send in propranolol 20 mg 3 times daily as needed for anxiety but advised her to closely monitor her blood pressure and pulse rate while taking this medication.  Patient verbalized understanding and is agreeable to the plan. ? ?Return if symptoms worsen or fail to improve. ?___________________________________________ ?Clearnce Sorrel, DNP, APRN, FNP-BC ?Primary Care and Sports Medicine ?[place] ?

## 2021-11-16 LAB — C. TRACHOMATIS/N. GONORRHOEAE RNA
C. trachomatis RNA, TMA: NOT DETECTED
N. gonorrhoeae RNA, TMA: NOT DETECTED

## 2021-11-17 LAB — URINE CULTURE
MICRO NUMBER:: 13375776
SPECIMEN QUALITY:: ADEQUATE

## 2021-11-21 ENCOUNTER — Other Ambulatory Visit: Payer: Self-pay | Admitting: Medical-Surgical

## 2022-02-01 ENCOUNTER — Encounter: Payer: Self-pay | Admitting: Medical-Surgical

## 2022-02-01 NOTE — Telephone Encounter (Signed)
Patient scheduled.

## 2022-02-07 ENCOUNTER — Telehealth: Payer: Self-pay | Admitting: Physician Assistant

## 2022-02-07 DIAGNOSIS — N898 Other specified noninflammatory disorders of vagina: Secondary | ICD-10-CM

## 2022-02-07 MED ORDER — FLUCONAZOLE 150 MG PO TABS: 150.0000 mg | ORAL_TABLET | Freq: Once | ORAL | 0 refills | Status: AC

## 2022-02-07 MED ORDER — METRONIDAZOLE 500 MG PO TABS: 500.0000 mg | ORAL_TABLET | Freq: Two times a day (BID) | ORAL | 0 refills | Status: DC

## 2022-02-07 NOTE — Patient Instructions (Signed)
  Lenore Manner, thank you for joining [name], PA-C for today's virtual visit.  While this provider is not your primary care provider (PCP), if your PCP is located in our provider database this encounter information will be shared with them immediately following your visit.  Consent: (Patient) Valerie Walters provided verbal consent for this virtual visit at the beginning of the encounter.  Current Medications:  Current Outpatient Medications:    OLANZapine (ZYPREXA) 5 MG tablet, TAKE 1 TABLET BY MOUTH AT BEDTIME, Disp: 90 tablet, Rfl: 0   propranolol (INDERAL) 20 MG tablet, Take 1 tablet (20 mg total) by mouth 3 (three) times daily as needed., Disp: 90 tablet, Rfl: 0   Medications ordered in this encounter:  No orders of the defined types were placed in this encounter.    *If you need refills on other medications prior to your next appointment, please contact your pharmacy*  Follow-Up: Call back or seek an in-person evaluation if the symptoms worsen or if the condition fails to improve as anticipated.  Other Instructions Please take the medications as directed. Keep hydrated. Avoid any scented or dyed soaps, lotions or lubricants to the area.  If not resolving, you will need an in-person evaluation.    If you have been instructed to have an in-person evaluation today at a local Urgent Care facility, please use the link below. It will take you to a list of all of our available [facility], including address, phone number and hours of operation. Please do not delay care.  [facility]  If you or a family member do not have a primary care provider, use the link below to schedule a visit and establish care. When you choose a [facility] primary care physician or advanced practice provider, you gain a long-term partner in health. Find a Primary Care Provider  Learn more about [facility]'s in-office and virtual care options: [facility] - Get Care Now

## 2022-02-07 NOTE — Progress Notes (Signed)
Virtual Visit Consent   Valerie Walters, you are scheduled for a virtual visit with a [facility] provider today. Just as with appointments in the office, your consent must be obtained to participate. Your consent will be active for this visit and any virtual visit you may have with one of our providers in the next 365 days. If you have a MyChart account, a copy of this consent can be sent to you electronically.  As this is a virtual visit, video technology does not allow for your provider to perform a traditional examination. This may limit your provider's ability to fully assess your condition. If your provider identifies any concerns that need to be evaluated in person or the need to arrange testing (such as labs, EKG, etc.), we will make arrangements to do so. Although advances in technology are sophisticated, we cannot ensure that it will always work on either your end or our end. If the connection with a video visit is poor, the visit may have to be switched to a telephone visit. With either a video or telephone visit, we are not always able to ensure that we have a secure connection.  By engaging in this virtual visit, you consent to the provision of healthcare and authorize for your insurance to be billed (if applicable) for the services provided during this visit. Depending on your insurance coverage, you may receive a charge related to this service.  I need to obtain your verbal consent now. Are you willing to proceed with your visit today? Valerie Walters has provided verbal consent on 02/07/2022 for a virtual visit (video or telephone). [name], [place]  Date: 02/07/2022 11:50 AM  Virtual Visit via Video Note   I, [name], connected with  Valerie Walters  ([id number], 28-Mar-1981) on 02/07/22 at 11:45 AM EDT by a video-enabled telemedicine application and verified that I am speaking with the correct person using two identifiers.  Location: Patient: Virtual Visit Location  Patient: Home Provider: Virtual Visit Location Provider: Home Office   I discussed the limitations of evaluation and management by telemedicine and the availability of in person appointments. The patient expressed understanding and agreed to proceed.    History of Present Illness: Valerie Walters is a 41 y.o. who identifies as a female who was assigned female at birth, and is being seen today for possible yeast and BV. Notes mild vaginal irritation along with itching. Initially with some thicker discharge. Used OTC monistat which seemed to help some. Now notes some discharge with odor. Denies concern for pregnancy. Denies concern for STI.Marland Kitchen Denies dysuria, urgency, frequency or hematuria.   HPI: HPI  Problems:  Patient Active Problem List   Diagnosis Date Noted   Hyperlipidemia    Stimulant-induced mood disorder (HCC) 08/16/2019   Obesity (BMI 30-39.9) 02/05/2019   Neck pain 02/05/2019   Myalgia 02/05/2019   Chronic pain syndrome 02/05/2019   Depression with anxiety 02/20/2018   Paranoia (HCC) 02/20/2018   Mania (HCC) 02/20/2018   Chronic left-sided low back pain without sciatica 11/26/2014    Allergies: No Known Allergies Medications:  Current Outpatient Medications:    fluconazole (DIFLUCAN) 150 MG tablet, Take 1 tablet (150 mg total) by mouth once for 1 dose., Disp: 1 tablet, Rfl: 0   metroNIDAZOLE (FLAGYL) 500 MG tablet, Take 1 tablet (500 mg total) by mouth 2 (two) times daily., Disp: 14 tablet, Rfl: 0   OLANZapine (ZYPREXA) 5 MG tablet, TAKE 1 TABLET BY MOUTH AT BEDTIME, Disp: 90 tablet, Rfl:  0   propranolol (INDERAL) 20 MG tablet, Take 1 tablet (20 mg total) by mouth 3 (three) times daily as needed., Disp: 90 tablet, Rfl: 0  Observations/Objective: Patient is well-developed, well-nourished in no acute distress.  Resting comfortably at home.  Head is normocephalic, atraumatic.  No labored breathing. Speech is clear and coherent with logical content.  Patient is alert and  oriented at baseline.   Assessment and Plan: 1. Vaginal discharge - fluconazole (DIFLUCAN) 150 MG tablet; Take 1 tablet (150 mg total) by mouth once for 1 dose.  Dispense: 1 tablet; Refill: 0 - metroNIDAZOLE (FLAGYL) 500 MG tablet; Take 1 tablet (500 mg total) by mouth 2 (two) times daily.  Dispense: 14 tablet; Refill: 0  Concern for residual yeast and now secondary BV starting. Will start Flagyl and Diflucan. Supportive measures reviewed. If not resolving, will need an in-person evaluation.   Follow Up Instructions: I discussed the assessment and treatment plan with the patient. The patient was provided an opportunity to ask questions and all were answered. The patient agreed with the plan and demonstrated an understanding of the instructions.  A copy of instructions were sent to the patient via MyChart unless otherwise noted below.   The patient was advised to call back or seek an in-person evaluation if the symptoms worsen or if the condition fails to improve as anticipated.  Time:  I spent 7 minutes with the patient via telehealth technology discussing the above problems/concerns.    [name], PA-C

## 2022-02-16 ENCOUNTER — Ambulatory Visit (INDEPENDENT_AMBULATORY_CARE_PROVIDER_SITE_OTHER): Payer: Self-pay | Admitting: Family Medicine

## 2022-02-16 DIAGNOSIS — Z91199 Patient's noncompliance with other medical treatment and regimen due to unspecified reason: Secondary | ICD-10-CM

## 2022-02-16 NOTE — Progress Notes (Signed)
No Show   Charlton Amor, DO

## 2022-06-14 ENCOUNTER — Telehealth: Payer: Self-pay | Admitting: Physician Assistant

## 2022-06-14 DIAGNOSIS — N760 Acute vaginitis: Secondary | ICD-10-CM

## 2022-06-14 DIAGNOSIS — B9689 Other specified bacterial agents as the cause of diseases classified elsewhere: Secondary | ICD-10-CM

## 2022-06-14 MED ORDER — FLUCONAZOLE 150 MG PO TABS: 150.0000 mg | ORAL_TABLET | Freq: Once | ORAL | 0 refills | Status: AC

## 2022-06-14 MED ORDER — METRONIDAZOLE 500 MG PO TABS: ORAL_TABLET | ORAL | 0 refills | Status: AC

## 2022-06-14 NOTE — Progress Notes (Signed)
Virtual Visit Consent   Valerie Walters, you are scheduled for a virtual visit with a [facility] provider today. Just as with appointments in the office, your consent must be obtained to participate. Your consent will be active for this visit and any virtual visit you may have with one of our providers in the next 365 days. If you have a MyChart account, a copy of this consent can be sent to you electronically.  As this is a virtual visit, video technology does not allow for your provider to perform a traditional examination. This may limit your provider's ability to fully assess your condition. If your provider identifies any concerns that need to be evaluated in person or the need to arrange testing (such as labs, EKG, etc.), we will make arrangements to do so. Although advances in technology are sophisticated, we cannot ensure that it will always work on either your end or our end. If the connection with a video visit is poor, the visit may have to be switched to a telephone visit. With either a video or telephone visit, we are not always able to ensure that we have a secure connection.  By engaging in this virtual visit, you consent to the provision of healthcare and authorize for your insurance to be billed (if applicable) for the services provided during this visit. Depending on your insurance coverage, you may receive a charge related to this service.  I need to obtain your verbal consent now. Are you willing to proceed with your visit today? Valerie Walters has provided verbal consent on 06/14/2022 for a virtual visit (video or telephone). Valerie Walters, [place]  Date: 06/14/2022 3:00 PM  Virtual Visit via Video Note   I, Valerie Walters, connected with  Valerie Walters  ([id number], 1981-07-01) on 06/14/22 at  2:45 PM EST by a video-enabled telemedicine application and verified that I am speaking with the correct person using two identifiers.  Location: Patient: Virtual Visit Location  Patient: Mobile Provider: Virtual Visit Location Provider: Home Office   I discussed the limitations of evaluation and management by telemedicine and the availability of in person appointments. The patient expressed understanding and agreed to proceed.    History of Present Illness: Valerie Walters is a 41 y.o. who identifies as a female who was assigned female at birth, and is being seen today for 1 week of vaginal irritation and odorous discharge after intercourse with husband. Denies concern for STI. Notes she occasionally gets bv after intercourse.   HPI: HPI  Problems:  Patient Active Problem List   Diagnosis Date Noted   Hyperlipidemia    Stimulant-induced mood disorder (HCC) 08/16/2019   Obesity (BMI 30-39.9) 02/05/2019   Neck pain 02/05/2019   Myalgia 02/05/2019   Chronic pain syndrome 02/05/2019   Depression with anxiety 02/20/2018   Paranoia (HCC) 02/20/2018   Mania (HCC) 02/20/2018   Chronic left-sided low back pain without sciatica 11/26/2014    Allergies: No Known Allergies Medications:  Current Outpatient Medications:    fluconazole (DIFLUCAN) 150 MG tablet, Take 1 tablet (150 mg total) by mouth once for 1 dose., Disp: 1 tablet, Rfl: 0   metroNIDAZOLE (FLAGYL) 500 MG tablet, Take 4 tablets by mouth as a single dose., Disp: 4 tablet, Rfl: 0   OLANZapine (ZYPREXA) 5 MG tablet, TAKE 1 TABLET BY MOUTH AT BEDTIME, Disp: 90 tablet, Rfl: 0   propranolol (INDERAL) 20 MG tablet, Take 1 tablet (20 mg total) by mouth 3 (three) times daily as needed., Disp:  90 tablet, Rfl: 0  Observations/Objective: Patient is well-developed, well-nourished in no acute distress.  Resting comfortably at home.  Head is normocephalic, atraumatic.  No labored breathing. Speech is clear and coherent with logical content.  Patient is alert and oriented at baseline.   Assessment and Plan: 1. BV (bacterial vaginosis) - metroNIDAZOLE (FLAGYL) 500 MG tablet; Take 4 tablets by mouth as a single dose.   Dispense: 4 tablet; Refill: 0  Supportive measures discussed. Flagyl per orders. Will add-on diflucan in case on antibiotic induced yeast.   Follow Up Instructions: I discussed the assessment and treatment plan with the patient. The patient was provided an opportunity to ask questions and all were answered. The patient agreed with the plan and demonstrated an understanding of the instructions.  A copy of instructions were sent to the patient via MyChart unless otherwise noted below.   The patient was advised to call back or seek an in-person evaluation if the symptoms worsen or if the condition fails to improve as anticipated.  Time:  I spent 10 minutes with the patient via telehealth technology discussing the above problems/concerns.    Valerie Rio, PA-C

## 2022-06-14 NOTE — Patient Instructions (Signed)
Lenore Manner, thank you for joining [name], PA-C for today's virtual visit.  While this provider is not your primary care provider (PCP), if your PCP is located in our provider database this encounter information will be shared with them immediately following your visit.   A [facility] MyChart account gives you access to today's visit and all your visits, tests, and labs performed at [facility] " click here if you don't have a [facility] MyChart account or go to [email or web address]  Consent: (Patient) Valerie Walters provided verbal consent for this virtual visit at the beginning of the encounter.  Current Medications:  Current Outpatient Medications:    metroNIDAZOLE (FLAGYL) 500 MG tablet, Take 1 tablet (500 mg total) by mouth 2 (two) times daily., Disp: 14 tablet, Rfl: 0   OLANZapine (ZYPREXA) 5 MG tablet, TAKE 1 TABLET BY MOUTH AT BEDTIME, Disp: 90 tablet, Rfl: 0   propranolol (INDERAL) 20 MG tablet, Take 1 tablet (20 mg total) by mouth 3 (three) times daily as needed., Disp: 90 tablet, Rfl: 0   Medications ordered in this encounter:  No orders of the defined types were placed in this encounter.    *If you need refills on other medications prior to your next appointment, please contact your pharmacy*  Follow-Up: Call back or seek an in-person evaluation if the symptoms worsen or if the condition fails to improve as anticipated.  [facility] [phone]  Other Instructions Bacterial Vaginosis  Bacterial vaginosis is an infection that occurs when the normal balance of bacteria in the vagina changes. This change is caused by an overgrowth of certain bacteria in the vagina. Bacterial vaginosis is the most common vaginal infection among females aged 24 to 39 years. This condition increases the risk of sexually transmitted infections (STIs). Treatment can help reduce this risk. Treatment is very important for pregnant women because this  condition can cause babies to be born early (prematurely) or at a low birth weight. What are the causes? This condition is caused by an increase in harmful bacteria that are normally present in small amounts in the vagina. However, the exact reason this condition develops is not known. You cannot get bacterial vaginosis from toilet seats, bedding, swimming pools, or contact with objects around you. What increases the risk? The following factors may make you more likely to develop this condition: Having a new sexual partner or multiple sexual partners, or having unprotected sex. Douching. Having an intrauterine device (IUD). Smoking. Abusing drugs and alcohol. This may lead to riskier sexual behavior. Taking certain antibiotic medicines. Being pregnant. What are the signs or symptoms? Some women with this condition have no symptoms. Symptoms may include: Wallace Cullens or white vaginal discharge. The discharge can be watery or foamy. A fish-like odor with discharge, especially after sex or during menstruation. Itching in and around the vagina. Burning or pain with urination. How is this diagnosed? This condition is diagnosed based on: Your medical history. A physical exam of the vagina. Checking a sample of vaginal fluid for harmful bacteria or abnormal cells. How is this treated? This condition is treated with antibiotic medicines. These may be given as a pill, a vaginal cream, or a medicine that is put into the vagina (suppository). If the condition comes back after treatment, a second round of antibiotics may be needed. Follow these instructions at home: Medicines Take or apply over-the-counter and prescription medicines only as told by your health care provider. Take or apply your antibiotic medicine as  told by your health care provider. Do not stop using the antibiotic even if you start to feel better. General instructions If you have a female sexual partner, tell her that you have a vaginal  infection. She should follow up with her health care provider. If you have a female sexual partner, he does not need treatment. Avoid sexual activity until you finish treatment. Drink enough fluid to keep your urine pale yellow. Keep the area around your vagina and rectum clean. Wash the area daily with warm water. Wipe yourself from front to back after using the toilet. If you are breastfeeding, talk to your health care provider about continuing breastfeeding during treatment. Keep all follow-up visits. This is important. How is this prevented? Self-care Do not douche. Wash the outside of your vagina with warm water only. Wear cotton or cotton-lined underwear. Avoid wearing tight pants and pantyhose, especially during the summer. Safe sex Use protection when having sex. This includes: Using condoms. Using dental dams. This is a thin layer of a material made of latex or polyurethane that protects the mouth during oral sex. Limit the number of sexual partners. To help prevent bacterial vaginosis, it is best to have sex with just one partner (monogamous relationship). Make sure you and your sexual partner are tested for STIs. Drugs and alcohol Do not use any products that contain nicotine or tobacco. These products include cigarettes, chewing tobacco, and vaping devices, such as e-cigarettes. If you need help quitting, ask your health care provider. Do not use drugs. Do not drink alcohol if: Your health care provider tells you not to do this. You are pregnant, may be pregnant, or are planning to become pregnant. If you drink alcohol: Limit how much you have to 0-1 drink a day. Be aware of how much alcohol is in your drink. In the [place] one drink equals one 12 oz bottle of beer (355 mL), one 5 oz glass of wine (148 mL), or one 1 oz glass of hard liquor (44 mL). Where to find more information Centers for Disease Control and Prevention: FootballExhibition.com.br American Sexual Health Association (ASHA):  www.ashastd.org U.S. Department of Health and Health and safety inspector, Office on Women's Health: http://hoffman.com/ Contact a health care provider if: Your symptoms do not improve, even after treatment. You have more discharge or pain when urinating. You have a fever or chills. You have pain in your abdomen or pelvis. You have pain during sex. You have vaginal bleeding between menstrual periods. Summary Bacterial vaginosis is a vaginal infection that occurs when the normal balance of bacteria in the vagina changes. It results from an overgrowth of certain bacteria. This condition increases the risk of sexually transmitted infections (STIs). Getting treated can help reduce this risk. Treatment is very important for pregnant women because this condition can cause babies to be born early (prematurely) or at low birth weight. This condition is treated with antibiotic medicines. These may be given as a pill, a vaginal cream, or a medicine that is put into the vagina (suppository). This information is not intended to replace advice given to you by your health care provider. Make sure you discuss any questions you have with your health care provider. Document Revised: 12/25/2019 Document Reviewed: 12/25/2019 Elsevier Patient Education  2023 Elsevier Inc.    If you have been instructed to have an in-person evaluation today at a local Urgent Care facility, please use the link below. It will take you to a list of all of our available [facility], including address, phone number and hours of operation. Please do not delay care.  [facility]  If you or a family member do not have a primary care provider, use the link below to schedule a visit and establish care. When you choose a [facility] primary care physician or advanced practice provider, you gain a long-term partner in health. Find a Primary Care Provider  Learn more about [facility] in-office and virtual care  options:  - Get Care Now

## 2022-07-26 ENCOUNTER — Other Ambulatory Visit: Payer: Self-pay | Admitting: Medical-Surgical

## 2022-08-22 ENCOUNTER — Other Ambulatory Visit: Payer: Self-pay | Admitting: Medical-Surgical

## 2022-08-29 ENCOUNTER — Ambulatory Visit (INDEPENDENT_AMBULATORY_CARE_PROVIDER_SITE_OTHER): Payer: Self-pay | Admitting: Medical-Surgical

## 2022-08-29 DIAGNOSIS — Z91199 Patient's noncompliance with other medical treatment and regimen due to unspecified reason: Secondary | ICD-10-CM

## 2022-08-29 NOTE — Progress Notes (Signed)
   Complete physical exam  Patient: Valerie Walters   DOB: 04/29/1998   42 y.o. Female  MRN: [id number]  Subjective:    No chief complaint on file.   Valerie A Lun is a 42 y.o. female who presents today for a complete physical exam. She reports consuming a {diet types:17450} diet. {types:19826} She generally feels {DESC; WELL/FAIRLY WELL/POORLY:18703}. She reports sleeping {DESC; WELL/FAIRLY WELL/POORLY:18703}. She {does/does not:200015} have additional problems to discuss today.    Most recent fall risk assessment:    01/04/2022   10:42 AM  Fall Risk   Falls in the past year? 0  Number falls in past yr: 0  Injury with Fall? 0  Risk for fall due to : No Fall Risks  Follow up Falls evaluation completed     Most recent depression screenings:    01/04/2022   10:42 AM 11/25/2020   10:46 AM  PHQ 2/9 Scores  PHQ - 2 Score 0 0  PHQ- 9 Score 5     {VISON DENTAL STD PSA (Optional):27386}  {History (Optional):23778}  Patient Care Team: Nikiyah Fackler, NP as PCP - General (Nurse Practitioner)   Outpatient Medications Prior to Visit  Medication Sig   fluticasone (FLONASE) 50 MCG/ACT nasal spray Place 2 sprays into both nostrils in the morning and at bedtime. After 7 days, reduce to once daily.   norgestimate-ethinyl estradiol (SPRINTEC 28) 0.25-35 MG-MCG tablet Take 1 tablet by mouth daily.   Nystatin POWD Apply liberally to affected area 2 times per day   spironolactone (ALDACTONE) 100 MG tablet Take 1 tablet (100 mg total) by mouth daily.   No facility-administered medications prior to visit.    ROS        Objective:     There were no vitals taken for this visit. {Vitals History (Optional):23777}  Physical Exam   No results found for any visits on 02/09/22. {Show previous labs (optional):23779}    Assessment & Plan:    Routine Health Maintenance and Physical Exam  Immunization History  Administered Date(s) Administered   DTaP 07/13/1999, 09/08/1999,  11/17/1999, 08/02/2000, 02/16/2004   Hepatitis A 12/13/2007, 12/18/2008   Hepatitis B 04/30/1999, 06/07/1999, 11/17/1999   HiB (PRP-OMP) 07/13/1999, 09/08/1999, 11/17/1999, 08/02/2000   IPV 07/13/1999, 09/08/1999, 05/07/2000, 02/16/2004   Influenza,inj,Quad PF,6+ Mos 03/20/2014   Influenza-Unspecified 06/19/2012   MMR 05/07/2001, 02/16/2004   Meningococcal Polysaccharide 12/18/2011   Pneumococcal Conjugate-13 08/02/2000   Pneumococcal-Unspecified 11/17/1999, 01/31/2000   Tdap 12/18/2011   Varicella 05/07/2000, 12/13/2007    Health Maintenance  Topic Date Due   HIV Screening  Never done   Hepatitis C Screening  Never done   INFLUENZA VACCINE  02/07/2022   PAP-Cervical Cytology Screening  02/09/2022 (Originally 04/28/2020)   PAP SMEAR-Modifier  02/09/2022 (Originally 04/28/2020)   TETANUS/TDAP  02/09/2022 (Originally 12/17/2021)   HPV VACCINES  Discontinued   COVID-19 Vaccine  Discontinued    Discussed health benefits of physical activity, and encouraged her to engage in regular exercise appropriate for her age and condition.  Problem List Items Addressed This Visit   None Visit Diagnoses     Annual physical exam    -  Primary   Cervical cancer screening       Need for Tdap vaccination          No follow-ups on file.     Yehia Mcbain, NP   

## 2022-08-31 ENCOUNTER — Ambulatory Visit: Payer: Self-pay | Admitting: Medical-Surgical

## 2022-09-02 ENCOUNTER — Encounter: Payer: Self-pay | Admitting: Medical-Surgical

## 2022-11-14 ENCOUNTER — Ambulatory Visit: Payer: Self-pay | Admitting: Medical-Surgical

## 2022-11-14 DIAGNOSIS — Z91199 Patient's noncompliance with other medical treatment and regimen due to unspecified reason: Secondary | ICD-10-CM

## 2022-11-14 DIAGNOSIS — Z124 Encounter for screening for malignant neoplasm of cervix: Secondary | ICD-10-CM

## 2022-11-14 NOTE — Progress Notes (Signed)
   Complete physical exam  Patient: Valerie Walters   DOB: 04/29/1998   42 y.o. Female  MRN: [id number]  Subjective:    No chief complaint on file.   Valerie Walters is a 42 y.o. female who presents today for a complete physical exam. She reports consuming a {diet types:17450} diet. {types:19826} She generally feels {DESC; WELL/FAIRLY WELL/POORLY:18703}. She reports sleeping {DESC; WELL/FAIRLY WELL/POORLY:18703}. She {does/does not:200015} have additional problems to discuss today.    Most recent fall risk assessment:    01/04/2022   10:42 AM  Fall Risk   Falls in the past year? 0  Number falls in past yr: 0  Injury with Fall? 0  Risk for fall due to : No Fall Risks  Follow up Falls evaluation completed     Most recent depression screenings:    01/04/2022   10:42 AM 11/25/2020   10:46 AM  PHQ 2/9 Scores  PHQ - 2 Score 0 0  PHQ- 9 Score 5     {VISON DENTAL STD PSA (Optional):27386}  {History (Optional):23778}  Patient Care Team: Ariz Terrones, NP as PCP - General (Nurse Practitioner)   Outpatient Medications Prior to Visit  Medication Sig   fluticasone (FLONASE) 50 MCG/ACT nasal spray Place 2 sprays into both nostrils in the morning and at bedtime. After 7 days, reduce to once daily.   norgestimate-ethinyl estradiol (SPRINTEC 28) 0.25-35 MG-MCG tablet Take 1 tablet by mouth daily.   Nystatin POWD Apply liberally to affected area 2 times per day   spironolactone (ALDACTONE) 100 MG tablet Take 1 tablet (100 mg total) by mouth daily.   No facility-administered medications prior to visit.    ROS        Objective:     There were no vitals taken for this visit. {Vitals History (Optional):23777}  Physical Exam   No results found for any visits on 02/09/22. {Show previous labs (optional):23779}    Assessment & Plan:    Routine Health Maintenance and Physical Exam  Immunization History  Administered Date(s) Administered   DTaP 07/13/1999, 09/08/1999,  11/17/1999, 08/02/2000, 02/16/2004   Hepatitis A 12/13/2007, 12/18/2008   Hepatitis B 04/30/1999, 06/07/1999, 11/17/1999   HiB (PRP-OMP) 07/13/1999, 09/08/1999, 11/17/1999, 08/02/2000   IPV 07/13/1999, 09/08/1999, 05/07/2000, 02/16/2004   Influenza,inj,Quad PF,6+ Mos 03/20/2014   Influenza-Unspecified 06/19/2012   MMR 05/07/2001, 02/16/2004   Meningococcal Polysaccharide 12/18/2011   Pneumococcal Conjugate-13 08/02/2000   Pneumococcal-Unspecified 11/17/1999, 01/31/2000   Tdap 12/18/2011   Varicella 05/07/2000, 12/13/2007    Health Maintenance  Topic Date Due   HIV Screening  Never done   Hepatitis C Screening  Never done   INFLUENZA VACCINE  02/07/2022   PAP-Cervical Cytology Screening  02/09/2022 (Originally 04/28/2020)   PAP SMEAR-Modifier  02/09/2022 (Originally 04/28/2020)   TETANUS/TDAP  02/09/2022 (Originally 12/17/2021)   HPV VACCINES  Discontinued   COVID-19 Vaccine  Discontinued    Discussed health benefits of physical activity, and encouraged her to engage in regular exercise appropriate for her age and condition.  Problem List Items Addressed This Visit   None Visit Diagnoses     Annual physical exam    -  Primary   Cervical cancer screening       Need for Tdap vaccination          No follow-ups on file.     Trinh Sanjose, NP   

## 2023-02-21 ENCOUNTER — Encounter: Payer: Self-pay | Admitting: Medical-Surgical
